# Patient Record
Sex: Female | Born: 1970 | Race: Black or African American | Hispanic: No | Marital: Single | Smoking: Current some day smoker
Health system: Southern US, Community
[De-identification: ages and names within clinical notes are randomized; demographics above are authoritative.]

## PROBLEM LIST (undated history)

## (undated) DIAGNOSIS — I82409 Acute embolism and thrombosis of unspecified deep veins of unspecified lower extremity: Secondary | ICD-10-CM

## (undated) DIAGNOSIS — N200 Calculus of kidney: Secondary | ICD-10-CM

## (undated) DIAGNOSIS — F1910 Other psychoactive substance abuse, uncomplicated: Secondary | ICD-10-CM

## (undated) DIAGNOSIS — G43909 Migraine, unspecified, not intractable, without status migrainosus: Secondary | ICD-10-CM

## (undated) DIAGNOSIS — K219 Gastro-esophageal reflux disease without esophagitis: Secondary | ICD-10-CM

## (undated) DIAGNOSIS — J189 Pneumonia, unspecified organism: Secondary | ICD-10-CM

## (undated) DIAGNOSIS — N390 Urinary tract infection, site not specified: Secondary | ICD-10-CM

## (undated) DIAGNOSIS — Z720 Tobacco use: Secondary | ICD-10-CM

## (undated) DIAGNOSIS — D649 Anemia, unspecified: Secondary | ICD-10-CM

## (undated) DIAGNOSIS — R569 Unspecified convulsions: Secondary | ICD-10-CM

## (undated) DIAGNOSIS — Z87442 Personal history of urinary calculi: Secondary | ICD-10-CM

## (undated) HISTORY — PX: ABDOMINAL HYSTERECTOMY: SHX81

## (undated) HISTORY — PX: ECTOPIC PREGNANCY SURGERY: SHX613

## (undated) HISTORY — PX: WISDOM TOOTH EXTRACTION: SHX21

---

## 2011-08-28 ENCOUNTER — Emergency Department (HOSPITAL_COMMUNITY)
Admission: EM | Admit: 2011-08-28 | Discharge: 2011-08-28 | Disposition: A | Discharge status: Home / Self Care | Payer: Medicaid Other | Attending: Emergency Medicine | Admitting: Emergency Medicine

## 2011-08-28 ENCOUNTER — Emergency Department (HOSPITAL_COMMUNITY): Payer: Medicaid Other

## 2011-08-28 DIAGNOSIS — R0602 Shortness of breath: Secondary | ICD-10-CM | POA: Insufficient documentation

## 2011-08-28 DIAGNOSIS — G43909 Migraine, unspecified, not intractable, without status migrainosus: Secondary | ICD-10-CM | POA: Insufficient documentation

## 2011-08-28 DIAGNOSIS — F411 Generalized anxiety disorder: Secondary | ICD-10-CM | POA: Insufficient documentation

## 2011-08-28 DIAGNOSIS — I059 Rheumatic mitral valve disease, unspecified: Secondary | ICD-10-CM | POA: Insufficient documentation

## 2011-08-28 DIAGNOSIS — R079 Chest pain, unspecified: Secondary | ICD-10-CM | POA: Insufficient documentation

## 2011-08-28 LAB — DIFFERENTIAL
Eosinophils Absolute: 0.1 10*3/uL (ref 0.0–0.7)
Eosinophils Relative: 2 % (ref 0–5)
Lymphs Abs: 2.6 10*3/uL (ref 0.7–4.0)
Monocytes Absolute: 0.4 10*3/uL (ref 0.1–1.0)
Monocytes Relative: 6 % (ref 3–12)

## 2011-08-28 LAB — CBC
MCH: 28.3 pg (ref 26.0–34.0)
MCV: 86 fL (ref 78.0–100.0)
Platelets: 254 10*3/uL (ref 150–400)
RBC: 4.2 MIL/uL (ref 3.87–5.11)
RDW: 13.9 % (ref 11.5–15.5)

## 2011-08-28 LAB — BASIC METABOLIC PANEL
BUN: 10 mg/dL (ref 6–23)
GFR calc non Af Amer: 85 mL/min — ABNORMAL LOW (ref >90)
Glucose, Bld: 90 mg/dL (ref 70–99)
Potassium: 3.8 mEq/L (ref 3.5–5.1)

## 2012-02-08 ENCOUNTER — Encounter (HOSPITAL_COMMUNITY): Payer: Self-pay | Admitting: Emergency Medicine

## 2012-02-08 ENCOUNTER — Emergency Department (HOSPITAL_COMMUNITY)
Admission: EM | Admit: 2012-02-08 | Discharge: 2012-02-09 | Disposition: A | Discharge status: Home / Self Care | Payer: Medicaid Other | Attending: Emergency Medicine | Admitting: Emergency Medicine

## 2012-02-08 DIAGNOSIS — G40909 Epilepsy, unspecified, not intractable, without status epilepticus: Secondary | ICD-10-CM | POA: Insufficient documentation

## 2012-02-08 DIAGNOSIS — R51 Headache: Secondary | ICD-10-CM | POA: Insufficient documentation

## 2012-02-08 DIAGNOSIS — Z79899 Other long term (current) drug therapy: Secondary | ICD-10-CM | POA: Insufficient documentation

## 2012-02-08 DIAGNOSIS — R197 Diarrhea, unspecified: Secondary | ICD-10-CM | POA: Insufficient documentation

## 2012-02-08 DIAGNOSIS — R112 Nausea with vomiting, unspecified: Secondary | ICD-10-CM

## 2012-02-08 DIAGNOSIS — K089 Disorder of teeth and supporting structures, unspecified: Secondary | ICD-10-CM | POA: Insufficient documentation

## 2012-02-08 DIAGNOSIS — R109 Unspecified abdominal pain: Secondary | ICD-10-CM | POA: Insufficient documentation

## 2012-02-08 HISTORY — DX: Migraine, unspecified, not intractable, without status migrainosus: G43.909

## 2012-02-08 HISTORY — DX: Unspecified convulsions: R56.9

## 2012-02-08 LAB — POCT I-STAT, CHEM 8
BUN: 9 mg/dL (ref 6–23)
Chloride: 111 mEq/L (ref 96–112)
HCT: 38 % (ref 36.0–46.0)
Potassium: 3.3 mEq/L — ABNORMAL LOW (ref 3.5–5.1)
Sodium: 141 mEq/L (ref 135–145)

## 2012-02-08 LAB — URINALYSIS, ROUTINE W REFLEX MICROSCOPIC
Glucose, UA: NEGATIVE mg/dL
Hgb urine dipstick: NEGATIVE
Ketones, ur: 40 mg/dL — AB
Protein, ur: 30 mg/dL — AB

## 2012-02-08 LAB — DIFFERENTIAL
Eosinophils Absolute: 0 10*3/uL (ref 0.0–0.7)
Eosinophils Relative: 0 % (ref 0–5)
Lymphs Abs: 1.4 10*3/uL (ref 0.7–4.0)
Monocytes Absolute: 0.7 10*3/uL (ref 0.1–1.0)
Monocytes Relative: 15 % — ABNORMAL HIGH (ref 3–12)

## 2012-02-08 LAB — CBC
MCH: 28.6 pg (ref 26.0–34.0)
MCV: 82.8 fL (ref 78.0–100.0)
Platelets: 161 10*3/uL (ref 150–400)
RBC: 4.3 MIL/uL (ref 3.87–5.11)

## 2012-02-08 LAB — URINE MICROSCOPIC-ADD ON

## 2012-02-08 MED ORDER — HYDROMORPHONE HCL PF 1 MG/ML IJ SOLN
0.5000 mg | Freq: Once | INTRAMUSCULAR | Status: AC
  Administered 2012-02-08: 0.5 mg via INTRAVENOUS
  Filled 2012-02-08: qty 1

## 2012-02-08 MED ORDER — PROMETHAZINE HCL 25 MG RE SUPP: 25.0000 mg | Freq: Four times a day (QID) | RECTAL | Status: DC | PRN

## 2012-02-08 MED ORDER — ONDANSETRON HCL 4 MG/2ML IJ SOLN
4.0000 mg | Freq: Once | INTRAMUSCULAR | Status: AC
  Administered 2012-02-08: 4 mg via INTRAVENOUS
  Filled 2012-02-08: qty 2

## 2012-02-08 MED ORDER — HYDROCODONE-ACETAMINOPHEN 5-325 MG PO TABS: 1.0000 | ORAL_TABLET | ORAL | Status: AC | PRN

## 2012-02-08 MED ORDER — SODIUM CHLORIDE 0.9 % IV BOLUS (SEPSIS)
1000.0000 mL | Freq: Once | INTRAVENOUS | Status: AC
  Administered 2012-02-08: 1000 mL via INTRAVENOUS

## 2012-02-08 MED ORDER — MORPHINE SULFATE 4 MG/ML IJ SOLN
4.0000 mg | Freq: Once | INTRAMUSCULAR | Status: AC
  Administered 2012-02-08: 4 mg via INTRAVENOUS
  Filled 2012-02-08: qty 1

## 2012-02-08 MED ORDER — PROMETHAZINE HCL 25 MG PO TABS: 25.0000 mg | ORAL_TABLET | Freq: Four times a day (QID) | ORAL | Status: DC | PRN

## 2012-02-08 MED ORDER — PENICILLIN V POTASSIUM 250 MG PO TABS: 250.0000 mg | ORAL_TABLET | Freq: Four times a day (QID) | ORAL | Status: AC

## 2012-02-08 MED ORDER — PROMETHAZINE HCL 25 MG/ML IJ SOLN
25.0000 mg | Freq: Once | INTRAMUSCULAR | Status: AC
  Administered 2012-02-08: 25 mg via INTRAVENOUS
  Filled 2012-02-08: qty 1

## 2012-02-08 NOTE — ED Notes (Signed)
Ginger ale offered at this time.

## 2012-02-08 NOTE — Discharge Instructions (Signed)
You have been treated for nausea, vomiting, diarrhea here today. Please stick to a clear liquid diet for the next 24 hours then slowly advance back to normal diet as tolerated. Use the Phenergan as needed for nausea. You've been given prescription for tablet and suppository form to use in case he cannot keep down the tablet. Use the pain medicine as needed. Take the penicillin for dental pain until it is gone. Return to the ER for worsening condition, high fever not controlled by medication, or any other worrisome symptoms.  Clear Liquid Diet The clear liquid dietconsists of foods that are liquid or will become liquid at room temperature.You should be able to see through the liquid and beverages. Examples of foods allowed on a clear liquid diet include fruit juice, broth or bouillon, gelatin, or frozen ice pops. The purpose of this diet is to provide necessary fluid, electrolytes such as sodium and potassium, and energy to keep the body functioning during times when you are not able to consume a regular diet.A clear liquid diet should not be continued for long periods of time as it is not nutritionally adequate.  REASONS FOR USING A CLEAR LIQUID DIET  In sudden onset (acute) conditions for a patient before or after surgery.   As the first step in oral feeding.   For fluid and electrolyte replacement in diarrheal diseases.   As a diet before certain medical tests are performed.  ADEQUACY The clear liquid diet is adequate only in ascorbic acid, according to the Recommended Dietary Allowances of the [employer]. CHOOSING FOODS Breads and Starches  Allowed:  None are allowed.   Avoid: All are avoided.  Vegetables  Allowed:  Strained tomato or vegetable juice.   Avoid: Any others.  Fruit  Allowed:  Strained fruit juices and fruit drinks. Include 1 serving of citrus or vitamin C-enriched fruit juice daily.   Avoid: Any others.  Meat and Meat Substitutes  Allowed:  None  are allowed.   Avoid: All are avoided.  Milk  Allowed:  None are allowed.   Avoid: All are avoided.  Soups and Combination Foods  Allowed:  Clear bouillon, broth, or strained broth-based soups.   Avoid: Any others.  Desserts and Sweets  Allowed:  Sugar, honey. High protein gelatin. Flavored gelatin, ices, or frozen ice pops that do not contain milk.   Avoid: Any others.  Fats and Oils  Allowed:  None are allowed.   Avoid: All are avoided.  Beverages  Allowed: Cereal beverages, coffee (regular or decaffeinated), tea, or soda at the discretion of your caregiver.   Avoid: Any others.  Condiments  Allowed:  Iodized salt.   Avoid: Any others, including pepper.  Supplements  Allowed:  Liquid nutrition beverages.   Avoid: Any others that contain lactose or fiber.  SAMPLE MEAL PLAN Breakfast  4 oz (120 mL) strained orange juice.    to 1 cup (125 to 250 mL) gelatin (plain or fortified).   1 cup (250 mL) beverage (coffee or tea).   Sugar, if desired.  Midmorning Snack   cup (125 mL) gelatin (plain or fortified).  Lunch  1 cup (250 mL) broth or consomm.   4 oz (120 mL) strained grapefruit juice.    cup (125 mL) gelatin (plain or fortified).   1 cup (250 mL) beverage (coffee or tea).   Sugar, if desired.  Midafternoon Snack   cup (125 mL) fruit ice.    cup (125 mL) strained fruit juice.  Dinner  1 cup (250 mL) broth or consomm.    cup (125 mL) cranberry juice.    cup (125 mL) flavored gelatin (plain or fortified).   1 cup (250 mL) beverage (coffee or tea).   Sugar, if desired.  Evening Snack  4 oz (120 mL) strained apple juice (vitamin C-fortified).    cup (125 mL) flavored gelatin (plain or fortified).  Document Released: 10/19/2005 Document Revised: 10/08/2011 Document Reviewed: 01/16/2011 [facility] Patient Information 2012 [place], [place].

## 2012-02-08 NOTE — ED Provider Notes (Signed)
Signout from [place], [place] at 2000. Patient presented with abdominal pain. She states that her entire family had similar symptoms. She has additionally had a headache which she has attributed to dental pain. She has been treated with pain and nausea medicine here and is currently undergoing fluid challenge.  Patient was rechecked around 10 PM and had had a recurrent episode of emesis after receiving Zofran and attempting fluid challenge. She was remedicated with Phenergan. Will reattempt fluid challenge.  11:35 PM Patient states that she is currently feeling better and has had no recurrent episodes of emesis. Abdomen remained soft and minimally tender. Will discharge home with pain, nausea, antibiotic medication. Return precautions discussed.   Grant Fontana, [place] 02/08/12 2336

## 2012-02-08 NOTE — ED Provider Notes (Signed)
History     CSN: [id number]  Arrival date & time 02/08/12  1531   First MD Initiated Contact with Patient 02/08/12 1715      Chief Complaint  Patient presents with  . Abdominal Pain  . Nausea    (Consider location/radiation/quality/duration/timing/severity/associated sxs/prior treatment) Patient is a 41 y.o. female presenting with abdominal pain and headaches. The history is provided by the patient.  Abdominal Pain The primary symptoms of the illness include abdominal pain, nausea, vomiting and diarrhea. The primary symptoms of the illness do not include fever or shortness of breath. The current episode started yesterday. The onset of the illness was gradual.  Vomiting occurs 2 to 5 times per day.  Symptoms associated with the illness do not include chills. Associated symptoms comments: She reports several family members at home with same symptoms. .  Headache  The current episode started yesterday. The problem occurs constantly. The problem has been gradually worsening. Associated symptoms include nausea and vomiting. Pertinent negatives include no fever and no shortness of breath. Associated symptoms comments: She reports her headache moved into her upper jaw and she now associates it with teeth that are painful. No facial swelling..    Past Medical History  Diagnosis Date  . Migraines   . Seizure     Past Surgical History  Procedure Date  . Ectopic pregnancy surgery     No family history on file.  History  Substance Use Topics  . Smoking status: Current Everyday Smoker    Types: Cigarettes  . Smokeless tobacco: Not on file  . Alcohol Use: Yes     socially    OB History    Grav Para Term Preterm Abortions TAB SAB Ect Mult Living                  Review of Systems  Constitutional: Negative for fever and chills.  HENT: Positive for dental problem.   Respiratory: Negative.  Negative for shortness of breath.   Cardiovascular: Negative.  Negative for chest pain.    Gastrointestinal: Positive for nausea, vomiting, abdominal pain and diarrhea. Negative for blood in stool.  Musculoskeletal: Negative.   Skin: Negative.   Neurological: Positive for headaches.    Allergies  Sulfur  Home Medications   Current Outpatient Rx  Name Route Sig Dispense Refill  . CARBAMAZEPINE ER 100 MG PO TB12 Oral Take 100 mg by mouth 2 (two) times daily.    . IBUPROFEN 400 MG PO TABS Oral Take 400 mg by mouth every 6 (six) hours as needed. For pain relief    . PSEUDOEPH-DOXYLAMINE-DM-APAP 60-7.03-31-999 MG/30ML PO LIQD Oral Take 30 mLs by mouth every 6 (six) hours as needed. For night time cold/flu symptom relief    . PSEUDOEPHEDRINE-APAP-DM 40-981-19 MG/30ML PO LIQD Oral Take 30 mLs by mouth every 4 (four) hours as needed. For daytime cold/flu symptom relief    . SUMATRIPTAN SUCCINATE 25 MG PO TABS Oral Take 25 mg by mouth every 2 (two) hours as needed.      BP 118/75  Pulse 60  Temp(Src) 98.1 F (36.7 C) (Oral)  Resp 16  SpO2 98%  LMP 01/22/2012  Physical Exam  Constitutional: She is oriented to person, place, and time. She appears well-developed and well-nourished.  HENT:  Head: Normocephalic.  Neck: Normal range of motion. Neck supple.  Cardiovascular: Normal rate and regular rhythm.   Pulmonary/Chest: Effort normal and breath sounds normal.  Abdominal: Soft. Bowel sounds are normal. There is no tenderness. There  is no rebound and no guarding.  Musculoskeletal: Normal range of motion.  Neurological: She is alert and oriented to person, place, and time. No cranial nerve deficit.  Skin: Skin is warm and dry. No rash noted.  Psychiatric: She has a normal mood and affect.    ED Course  Procedures (including critical care time)  Labs Reviewed  CBC - Abnormal; Notable for the following:    HCT 35.6 (*)    All other components within normal limits  DIFFERENTIAL - Abnormal; Notable for the following:    Monocytes Relative 15 (*)    All other components  within normal limits  URINALYSIS, ROUTINE W REFLEX MICROSCOPIC - Abnormal; Notable for the following:    APPearance TURBID (*)    Specific Gravity, Urine 1.031 (*)    Bilirubin Urine SMALL (*)    Ketones, ur 40 (*)    Protein, ur 30 (*)    All other components within normal limits  URINE MICROSCOPIC-ADD ON - Abnormal; Notable for the following:    Bacteria, UA MANY (*)    All other components within normal limits  POCT I-STAT, CHEM 8 - Abnormal; Notable for the following:    Potassium 3.3 (*)    All other components within normal limits   No results found.   No diagnosis found.    MDM  Patient care turned over to [facility]. She is attempting PO challenge of fluids and a second liter of IV fluids. Anticipate discharge home.         Rodena Medin, PA-C 02/08/12 2010

## 2012-02-08 NOTE — ED Notes (Signed)
Pt presenting to ed with c/o abdominal pain with positive nausea, vomiting and diarrhea x 2-3 days pt states her entire family has been sick with the same symptoms

## 2012-02-08 NOTE — ED Notes (Signed)
Offered ginger ale. 

## 2012-02-09 NOTE — ED Provider Notes (Signed)
Medical screening examination/treatment/procedure(s) were performed by non-physician practitioner and as supervising physician I was immediately available for consultation/collaboration.  Patient seen by me. Complains of nausea vomiting diarrhea. Also right temporal headache secondary to toothache  Donnetta Hutching, MD 02/09/12 1558

## 2012-02-09 NOTE — ED Provider Notes (Signed)
Medical screening examination/treatment/procedure(s) were conducted as a shared visit with non-physician practitioner(s) and myself.  I personally evaluated the patient during the encounter.  Patient complains of flulike symptoms including nausea vomiting and diarrhea. Also complains of right sided temporal headache secondary to tooth infection.  She is not septic and nontoxic  Donnetta Hutching, MD 02/09/12 1556

## 2012-03-09 ENCOUNTER — Encounter (HOSPITAL_COMMUNITY): Payer: Self-pay | Admitting: *Deleted

## 2012-03-09 ENCOUNTER — Inpatient Hospital Stay (HOSPITAL_COMMUNITY)
Admission: AD | Admit: 2012-03-09 | Discharge: 2012-03-09 | Disposition: A | Discharge status: Home / Self Care | Payer: Medicaid Other | Source: Ambulatory Visit | Attending: Obstetrics & Gynecology | Admitting: Obstetrics & Gynecology

## 2012-03-09 DIAGNOSIS — N760 Acute vaginitis: Secondary | ICD-10-CM | POA: Insufficient documentation

## 2012-03-09 DIAGNOSIS — B9689 Other specified bacterial agents as the cause of diseases classified elsewhere: Secondary | ICD-10-CM | POA: Insufficient documentation

## 2012-03-09 DIAGNOSIS — R109 Unspecified abdominal pain: Secondary | ICD-10-CM | POA: Insufficient documentation

## 2012-03-09 DIAGNOSIS — N390 Urinary tract infection, site not specified: Secondary | ICD-10-CM | POA: Insufficient documentation

## 2012-03-09 DIAGNOSIS — A499 Bacterial infection, unspecified: Secondary | ICD-10-CM | POA: Insufficient documentation

## 2012-03-09 HISTORY — DX: Urinary tract infection, site not specified: N39.0

## 2012-03-09 LAB — URINALYSIS, ROUTINE W REFLEX MICROSCOPIC
Bilirubin Urine: NEGATIVE
Protein, ur: NEGATIVE mg/dL
Urobilinogen, UA: 0.2 mg/dL (ref 0.0–1.0)

## 2012-03-09 LAB — WET PREP, GENITAL: Yeast Wet Prep HPF POC: NONE SEEN

## 2012-03-09 LAB — URINE MICROSCOPIC-ADD ON

## 2012-03-09 MED ORDER — METRONIDAZOLE 500 MG PO TABS: 500.0000 mg | ORAL_TABLET | Freq: Two times a day (BID) | ORAL | Status: AC

## 2012-03-09 MED ORDER — OXYCODONE-ACETAMINOPHEN 5-325 MG PO TABS
1.0000 | ORAL_TABLET | Freq: Four times a day (QID) | ORAL | Status: DC | PRN
  Administered 2012-03-09: 2 via ORAL
  Filled 2012-03-09 (×2): qty 1

## 2012-03-09 MED ORDER — OXYCODONE-ACETAMINOPHEN 5-325 MG PO TABS: 1.0000 | ORAL_TABLET | Freq: Four times a day (QID) | ORAL | Status: AC | PRN

## 2012-03-09 MED ORDER — CIPROFLOXACIN HCL 500 MG PO TABS: 500.0000 mg | ORAL_TABLET | Freq: Two times a day (BID) | ORAL | Status: AC

## 2012-03-09 MED ORDER — PHENAZOPYRIDINE HCL 100 MG PO TABS: 100.0000 mg | ORAL_TABLET | Freq: Three times a day (TID) | ORAL | Status: AC | PRN

## 2012-03-09 NOTE — MAU Note (Signed)
Pt states bladder pressure/uti s/s x2 weeks. Became dramatically worse 2 days ago. Hx incontinence post delivery 2 years ago. Extreme pain with intercourse. +CVA tenderness R side only. Noted pink d/c on tissue after wiping days ago, none present now. LMP- 02/07/2012

## 2012-03-09 NOTE — Discharge Instructions (Signed)
Urinary Tract Infection  Infections of the urinary tract can start in several places. A bladder infection (cystitis), a kidney infection (pyelonephritis), and a prostate infection (prostatitis) are different types of urinary tract infections (UTIs). They usually get better if treated with medicines (antibiotics) that kill germs. Take all the medicine until it is gone. You or your child may feel better in a few days, but TAKE ALL MEDICINE or the infection may not respond and may become more difficult to treat.  HOME CARE INSTRUCTIONS    Drink enough water and fluids to keep the urine clear or pale yellow. Cranberry juice is especially recommended, in addition to large amounts of water.   Avoid caffeine, tea, and carbonated beverages. They tend to irritate the bladder.   Alcohol may irritate the prostate.   Only take over-the-counter or prescription medicines for pain, discomfort, or fever as directed by your caregiver.  To prevent further infections:   Empty the bladder often. Avoid holding urine for long periods of time.   After a bowel movement, women should cleanse from front to back. Use each tissue only once.   Empty the bladder before and after sexual intercourse.  FINDING OUT THE RESULTS OF YOUR TEST  Not all test results are available during your visit. If your or your child's test results are not back during the visit, make an appointment with your caregiver to find out the results. Do not assume everything is normal if you have not heard from your caregiver or the medical facility. It is important for you to follow up on all test results.  SEEK MEDICAL CARE IF:    There is back pain.   Your baby is older than 3 months with a rectal temperature of 100.5 F (38.1 C) or higher for more than 1 day.   Your or your child's problems (symptoms) are no better in 3 days. Return sooner if you or your child is getting worse.  SEEK IMMEDIATE MEDICAL CARE IF:    There is severe back pain or lower abdominal  pain.   You or your child develops chills.   You have a fever.   Your baby is older than 3 months with a rectal temperature of 102 F (38.9 C) or higher.   Your baby is 3 months old or younger with a rectal temperature of 100.4 F (38 C) or higher.   There is nausea or vomiting.   There is continued burning or discomfort with urination.  MAKE SURE YOU:    Understand these instructions.   Will watch your condition.   Will get help right away if you are not doing well or get worse.  Document Released: 07/29/2005 Document Revised: 10/08/2011 Document Reviewed: 03/03/2007  [employer] Patient Information 2012 [employer].    Bacterial Vaginosis  Bacterial vaginosis (BV) is a vaginal infection where the normal balance of bacteria in the vagina is disrupted. The normal balance is then replaced by an overgrowth of certain bacteria. There are several different kinds of bacteria that can cause BV. BV is the most common vaginal infection in women of childbearing age.  CAUSES    The cause of BV is not fully understood. BV develops when there is an increase or imbalance of harmful bacteria.   Some activities or behaviors can upset the normal balance of bacteria in the vagina and put women at increased risk including:   Having a new sex partner or multiple sex partners.   Douching.   Using an intrauterine   device (IUD) for contraception.   It is not clear what role sexual activity plays in the development of BV. However, women that have never had sexual intercourse are rarely infected with BV.  Women do not get BV from toilet seats, bedding, swimming pools or from touching objects around them.   SYMPTOMS    Grey vaginal discharge.   A fish-like odor with discharge, especially after sexual intercourse.   Itching or burning of the vagina and vulva.   Burning or pain with urination.   Some women have no signs or symptoms at all.  DIAGNOSIS   Your caregiver must examine the vagina for signs of BV. Your  caregiver will perform lab tests and look at the sample of vaginal fluid through a microscope. They will look for bacteria and abnormal cells (clue cells), a pH test higher than 4.5, and a positive amine test all associated with BV.   RISKS AND COMPLICATIONS    Pelvic inflammatory disease (PID).   Infections following gynecology surgery.   Developing HIV.   Developing herpes virus.  TREATMENT   Sometimes BV will clear up without treatment. However, all women with symptoms of BV should be treated to avoid complications, especially if gynecology surgery is planned. Female partners generally do not need to be treated. However, BV may spread between female sex partners so treatment is helpful in preventing a recurrence of BV.    BV may be treated with antibiotics. The antibiotics come in either pill or vaginal cream forms. Either can be used with nonpregnant or pregnant women, but the recommended dosages differ. These antibiotics are not harmful to the baby.   BV can recur after treatment. If this happens, a second round of antibiotics will often be prescribed.   Treatment is important for pregnant women. If not treated, BV can cause a premature delivery, especially for a pregnant woman who had a premature birth in the past. All pregnant women who have symptoms of BV should be checked and treated.   For chronic reoccurrence of BV, treatment with a type of prescribed gel vaginally twice a week is helpful.  HOME CARE INSTRUCTIONS    Finish all medication as directed by your caregiver.   Do not have sex until treatment is completed.   Tell your sexual partner that you have a vaginal infection. They should see their caregiver and be treated if they have problems, such as a mild rash or itching.   Practice safe sex. Use condoms. Only have 1 sex partner.  PREVENTION   Basic prevention steps can help reduce the risk of upsetting the natural balance of bacteria in the vagina and developing BV:   Do not have sexual  intercourse (be abstinent).   Do not douche.   Use all of the medicine prescribed for treatment of BV, even if the signs and symptoms go away.   Tell your sex partner if you have BV. That way, they can be treated, if needed, to prevent reoccurrence.  SEEK MEDICAL CARE IF:    Your symptoms are not improving after 3 days of treatment.   You have increased discharge, pain, or fever.  MAKE SURE YOU:    Understand these instructions.   Will watch your condition.   Will get help right away if you are not doing well or get worse.  FOR MORE INFORMATION   Division of STD Prevention (DSTDP), Centers for Disease Control and Prevention: www.cdc.gov/std  American Social Health Association (ASHA): www.ashastd.org   Document Released:

## 2012-03-09 NOTE — MAU Provider Note (Signed)
Attestation of Attending Supervision of Advanced Practitioner: Evaluation and management procedures were performed by the OB Fellow/PA/CNM/NP under my supervision and collaboration. Chart reviewed, and agree with management and plan.  Cheyenne Schumm, M.D. 03/09/2012 9:11 PM   

## 2012-03-09 NOTE — Progress Notes (Signed)
Pt given warm pack and instructed not to have direct contact with skin.

## 2012-03-09 NOTE — MAU Provider Note (Signed)
Jasmine Christian [id number] @Unknown  by LMP Chief Complaint  Patient presents with  . Flank Pain  . Urinary Tract Infection     First Provider Initiated Contact with Patient 03/09/12 1823      SUBJECTIVE  HPI: Presents with 4 day hx of dysuria, frequency, urgency, hematuria progressively worse today with continuous suprapubic pain and right flank pain. No fever, N/V. No hx stones, but had frequent UTIs as a teen and was seen by urologist who told her her bladder was very small and did an IVP.    Past Medical History  Diagnosis Date  . Migraines   . Seizure   . Ectopic pregnancy   . UTI (lower urinary tract infection)    Past Surgical History  Procedure Date  . Ectopic pregnancy surgery   . Tubal ligation   . Wisdom tooth extraction    History   Social History  . Marital Status: Single    Spouse Name: N/A    Number of Children: N/A  . Years of Education: N/A   Occupational History  . Not on file.   Social History Main Topics  . Smoking status: Current Everyday Smoker -- 0.5 packs/day    Types: Cigarettes  . Smokeless tobacco: Not on file  . Alcohol Use: No     socially  . Drug Use: No  . Sexually Active:    Other Topics Concern  . Not on file   Social History Narrative  . No narrative on file   No current facility-administered medications on file prior to encounter.   Current Outpatient Prescriptions on File Prior to Encounter  Medication Sig Dispense Refill  . ibuprofen (ADVIL,MOTRIN) 400 MG tablet Take 400 mg by mouth every 6 (six) hours as needed. For pain      . SUMAtriptan (IMITREX) 20 MG/ACT nasal spray Place 1 spray into the nose every 2 (two) hours as needed. For migraine      . promethazine (PHENERGAN) 25 MG suppository Place 1 suppository (25 mg total) rectally every 6 (six) hours as needed for nausea.  12 each  0  . promethazine (PHENERGAN) 25 MG tablet Take 1 tablet (25 mg total) by mouth every 6 (six) hours as needed for nausea.  30 tablet   0   Allergies  Allergen Reactions  . Sulfur Anaphylaxis  . Aspartame And Phenylalanine Swelling    Swelling of throat, fever    ROS: Pertinent items in HPI  OBJECTIVE Blood pressure 120/64, pulse 71, temperature 98 F (36.7 C), temperature source Oral, resp. rate 16, height 5\' 7"  (1.702 m), weight 70.308 kg (155 lb), last menstrual period 02/07/2012, SpO2 100.00%.  GENERAL: Well-developed, well-nourished female in no acute distress.  HEENT: Normocephalic, good dentition HEART: normal rate RESP: normal effort ABDOMEN: Soft, nontender EXTREMITIES: Nontender, no edema NEURO: Alert and oriented SPECULUM EXAM: NEFG, copious white curdy discharge discharge, no blood noted, cervix multiparous and clean BIMANUAL: cervix without CMT; uterus retroverted, retroflexed; no adnexal tenderness or masses   LAB RESULTS  Results for orders placed during the hospital encounter of 03/09/12 (from the past 24 hour(s))  URINALYSIS, ROUTINE W REFLEX MICROSCOPIC     Status: Abnormal   Collection Time   03/09/12  5:01 PM      Component Value Range   Color, Urine YELLOW  YELLOW    APPearance CLOUDY (*) CLEAR    Specific Gravity, Urine 1.025  1.005 - 1.030    pH 6.0  5.0 - 8.0    Glucose,  UA NEGATIVE  NEGATIVE (mg/dL)   Hgb urine dipstick TRACE (*) NEGATIVE    Bilirubin Urine NEGATIVE  NEGATIVE    Ketones, ur NEGATIVE  NEGATIVE (mg/dL)   Protein, ur NEGATIVE  NEGATIVE (mg/dL)   Urobilinogen, UA 0.2  0.0 - 1.0 (mg/dL)   Nitrite POSITIVE (*) NEGATIVE    Leukocytes, UA SMALL (*) NEGATIVE   URINE MICROSCOPIC-ADD ON     Status: Abnormal   Collection Time   03/09/12  5:01 PM      Component Value Range   Squamous Epithelial / LPF MANY (*) RARE    WBC, UA 21-50  <3 (WBC/hpf)   RBC / HPF 0-2  <3 (RBC/hpf)   Bacteria, UA MANY (*) RARE   POCT PREGNANCY, URINE     Status: Normal   Collection Time   03/09/12  5:10 PM      Component Value Range   Preg Test, Ur NEGATIVE  NEGATIVE   WET PREP, GENITAL      Status: Abnormal   Collection Time   03/09/12  7:00 PM      Component Value Range   Yeast Wet Prep HPF POC NONE SEEN  NONE SEEN    Trich, Wet Prep NONE SEEN  NONE SEEN    Clue Cells Wet Prep HPF POC MANY (*) NONE SEEN    WBC, Wet Prep HPF POC MODERATE (*) NONE SEEN        ASSESSMENT UTI, early right pyelonephritis BV  PLAN Rx Cipro 566m bid x 10 d, Pyridium 200 mg bid. Instructions on force fluids and general measures. If not improved after 48 hr Cipro, follow up at [facility]. Rx Flagyl  See [facility] re: contraception.     Jasmine Christian 03/09/2012 6:54 PM

## 2012-10-03 ENCOUNTER — Encounter (HOSPITAL_COMMUNITY): Payer: Self-pay | Admitting: *Deleted

## 2012-10-03 ENCOUNTER — Emergency Department (HOSPITAL_COMMUNITY)
Admission: EM | Admit: 2012-10-03 | Discharge: 2012-10-04 | Disposition: A | Discharge status: Home / Self Care | Payer: Medicaid Other | Attending: Emergency Medicine | Admitting: Emergency Medicine

## 2012-10-03 DIAGNOSIS — Z8669 Personal history of other diseases of the nervous system and sense organs: Secondary | ICD-10-CM | POA: Insufficient documentation

## 2012-10-03 DIAGNOSIS — H9209 Otalgia, unspecified ear: Secondary | ICD-10-CM | POA: Insufficient documentation

## 2012-10-03 DIAGNOSIS — R112 Nausea with vomiting, unspecified: Secondary | ICD-10-CM | POA: Insufficient documentation

## 2012-10-03 DIAGNOSIS — Z79899 Other long term (current) drug therapy: Secondary | ICD-10-CM | POA: Insufficient documentation

## 2012-10-03 DIAGNOSIS — Z87440 Personal history of urinary (tract) infections: Secondary | ICD-10-CM | POA: Insufficient documentation

## 2012-10-03 DIAGNOSIS — G43909 Migraine, unspecified, not intractable, without status migrainosus: Secondary | ICD-10-CM | POA: Insufficient documentation

## 2012-10-03 DIAGNOSIS — K029 Dental caries, unspecified: Secondary | ICD-10-CM | POA: Insufficient documentation

## 2012-10-03 DIAGNOSIS — H53149 Visual discomfort, unspecified: Secondary | ICD-10-CM | POA: Insufficient documentation

## 2012-10-03 DIAGNOSIS — F17200 Nicotine dependence, unspecified, uncomplicated: Secondary | ICD-10-CM | POA: Insufficient documentation

## 2012-10-03 MED ORDER — TRAMADOL HCL 50 MG PO TABS: 50.0000 mg | ORAL_TABLET | Freq: Four times a day (QID) | ORAL | Status: DC | PRN

## 2012-10-03 MED ORDER — KETOROLAC TROMETHAMINE 30 MG/ML IJ SOLN
30.0000 mg | Freq: Once | INTRAMUSCULAR | Status: AC
  Administered 2012-10-03: 30 mg via INTRAVENOUS
  Filled 2012-10-03: qty 1

## 2012-10-03 MED ORDER — DIPHENHYDRAMINE HCL 50 MG/ML IJ SOLN
25.0000 mg | Freq: Once | INTRAMUSCULAR | Status: AC
  Administered 2012-10-03: 25 mg via INTRAVENOUS
  Filled 2012-10-03: qty 1

## 2012-10-03 MED ORDER — METOCLOPRAMIDE HCL 10 MG PO TABS: 10.0000 mg | ORAL_TABLET | Freq: Four times a day (QID) | ORAL | Status: DC

## 2012-10-03 MED ORDER — HYDROMORPHONE HCL PF 1 MG/ML IJ SOLN
1.0000 mg | Freq: Once | INTRAMUSCULAR | Status: AC
  Administered 2012-10-03: 1 mg via INTRAVENOUS
  Filled 2012-10-03: qty 1

## 2012-10-03 MED ORDER — METOCLOPRAMIDE HCL 5 MG/ML IJ SOLN
10.0000 mg | Freq: Once | INTRAMUSCULAR | Status: AC
  Administered 2012-10-03: 10 mg via INTRAVENOUS
  Filled 2012-10-03: qty 2

## 2012-10-03 MED ORDER — FENTANYL CITRATE 0.05 MG/ML IJ SOLN
50.0000 ug | Freq: Once | INTRAMUSCULAR | Status: AC
  Administered 2012-10-03: 50 ug via INTRAVENOUS
  Filled 2012-10-03: qty 2

## 2012-10-03 MED ORDER — ONDANSETRON HCL 4 MG/2ML IJ SOLN
4.0000 mg | Freq: Once | INTRAMUSCULAR | Status: AC
  Administered 2012-10-03: 4 mg via INTRAVENOUS
  Filled 2012-10-03: qty 2

## 2012-10-03 NOTE — ED Provider Notes (Signed)
History     CSN: [id number]  Arrival date & time 10/03/12  1733   First MD Initiated Contact with Patient 10/03/12 2001      Chief Complaint  Patient presents with  . Migraine  . Nausea    (Consider location/radiation/quality/duration/timing/severity/associated sxs/prior treatment) HPI Comments: Patient with Hx of Migraine Ha that has been well controled with medication, has not had Ha in over a year but yesterday started with typical pain behind R eye.  Also of note patient  has sever dental decay on the upper and lower molars on the R.   Patient is a 41 y.o. female presenting with migraines. The history is provided by the patient.  Migraine This is a new problem. The current episode started yesterday. The problem has been unchanged. Associated symptoms include headaches, nausea and vomiting. Pertinent negatives include no arthralgias, chills, fever, sore throat or weakness. Nothing aggravates the symptoms. She has tried nothing for the symptoms.    Past Medical History  Diagnosis Date  . Migraines   . Seizure   . Ectopic pregnancy   . UTI (lower urinary tract infection)     Past Surgical History  Procedure Date  . Ectopic pregnancy surgery   . Tubal ligation   . Wisdom tooth extraction     Family History  Problem Relation Age of Onset  . Cancer Maternal Aunt   . Cancer Maternal Grandmother   . Cancer Maternal Grandfather     History  Substance Use Topics  . Smoking status: Current Every Day Smoker -- 0.5 packs/day    Types: Cigarettes  . Smokeless tobacco: Not on file  . Alcohol Use: No     Comment: socially    OB History    Grav Para Term Preterm Abortions TAB SAB Ect Mult Living   5 4 4  1   1  4       Review of Systems  Constitutional: Negative for fever and chills.  HENT: Positive for ear pain. Negative for sore throat and rhinorrhea.   Eyes: Positive for photophobia. Negative for pain.  Respiratory: Negative for shortness of breath.     Gastrointestinal: Positive for nausea and vomiting.  Genitourinary: Negative.   Musculoskeletal: Negative for arthralgias.  Neurological: Positive for headaches. Negative for dizziness and weakness.  Hematological: Negative for adenopathy.    Allergies  Sulfur and Aspartame and phenylalanine  Home Medications   Current Outpatient Rx  Name  Route  Sig  Dispense  Refill  . METOCLOPRAMIDE HCL 10 MG PO TABS   Oral   Take 1 tablet (10 mg total) by mouth every 6 (six) hours.   30 tablet   0   . TRAMADOL HCL 50 MG PO TABS   Oral   Take 1 tablet (50 mg total) by mouth every 6 (six) hours as needed for pain.   15 tablet   0     BP 129/68  Pulse 60  Temp 99.3 F (37.4 C) (Oral)  Resp 17  SpO2 98%  LMP 09/19/2012  Physical Exam  Constitutional: She is oriented to person, place, and time. She appears well-developed and well-nourished.  HENT:  Head: Normocephalic.  Eyes: Pupils are equal, round, and reactive to light.  Neck: Normal range of motion.  Cardiovascular: Normal rate.   Pulmonary/Chest: Effort normal.  Musculoskeletal: Normal range of motion. She exhibits no edema and no tenderness.  Neurological: She is alert and oriented to person, place, and time.  Skin: Skin is warm. No rash  noted.    ED Course  Procedures (including critical care time)  Labs Reviewed - No data to display No results found.   1. Migraine headache   2. Dental caries       MDM  Will treat with Benadryl, Toradol and Reglan         Arman Filter, NP 10/03/12 2342

## 2012-10-03 NOTE — ED Notes (Signed)
[id number]<JY> Expected date:<BR> Expected time:<BR> Means of arrival:<BR> Comments:<BR> Headache ems iv given zofran

## 2012-10-03 NOTE — ED Notes (Signed)
Pain is 3/10

## 2012-10-03 NOTE — ED Notes (Signed)
Per ems: pt c/o headache, nausea and vomiting that began this morning. Hx of migraines, pt did not have medication to take, h/a progressively got worse throughout day. 18 gauge in left hand, 4mg  zofran give en route. bp 150 palpated, pulse 60

## 2012-10-04 NOTE — ED Provider Notes (Signed)
Medical screening examination/treatment/procedure(s) were performed by non-physician practitioner and as supervising physician I was immediately available for consultation/collaboration.  Taylor Spilde R. Dilpreet Faires, MD 10/04/12 0014 

## 2013-05-11 ENCOUNTER — Emergency Department (HOSPITAL_COMMUNITY)
Admission: EM | Admit: 2013-05-11 | Discharge: 2013-05-11 | Disposition: A | Discharge status: Home / Self Care | Payer: Medicaid Other | Attending: Emergency Medicine | Admitting: Emergency Medicine

## 2013-05-11 ENCOUNTER — Encounter (HOSPITAL_COMMUNITY): Payer: Self-pay | Admitting: Emergency Medicine

## 2013-05-11 ENCOUNTER — Emergency Department (HOSPITAL_COMMUNITY): Payer: Medicaid Other

## 2013-05-11 DIAGNOSIS — Z8742 Personal history of other diseases of the female genital tract: Secondary | ICD-10-CM | POA: Insufficient documentation

## 2013-05-11 DIAGNOSIS — Z8679 Personal history of other diseases of the circulatory system: Secondary | ICD-10-CM | POA: Insufficient documentation

## 2013-05-11 DIAGNOSIS — Z8669 Personal history of other diseases of the nervous system and sense organs: Secondary | ICD-10-CM | POA: Insufficient documentation

## 2013-05-11 DIAGNOSIS — F17200 Nicotine dependence, unspecified, uncomplicated: Secondary | ICD-10-CM | POA: Insufficient documentation

## 2013-05-11 DIAGNOSIS — N200 Calculus of kidney: Secondary | ICD-10-CM | POA: Insufficient documentation

## 2013-05-11 DIAGNOSIS — R6883 Chills (without fever): Secondary | ICD-10-CM | POA: Insufficient documentation

## 2013-05-11 DIAGNOSIS — R112 Nausea with vomiting, unspecified: Secondary | ICD-10-CM | POA: Insufficient documentation

## 2013-05-11 DIAGNOSIS — Z3202 Encounter for pregnancy test, result negative: Secondary | ICD-10-CM | POA: Insufficient documentation

## 2013-05-11 DIAGNOSIS — N390 Urinary tract infection, site not specified: Secondary | ICD-10-CM | POA: Insufficient documentation

## 2013-05-11 LAB — CBC WITH DIFFERENTIAL/PLATELET
Basophils Relative: 0 % (ref 0–1)
Eosinophils Absolute: 0 10*3/uL (ref 0.0–0.7)
Hemoglobin: 12.4 g/dL (ref 12.0–15.0)
MCHC: 33.5 g/dL (ref 30.0–36.0)
Monocytes Relative: 8 % (ref 3–12)
Neutro Abs: 7.2 10*3/uL (ref 1.7–7.7)
Neutrophils Relative %: 68 % (ref 43–77)
Platelets: 274 10*3/uL (ref 150–400)
RBC: 4.52 MIL/uL (ref 3.87–5.11)

## 2013-05-11 LAB — BASIC METABOLIC PANEL
BUN: 11 mg/dL (ref 6–23)
Calcium: 9.5 mg/dL (ref 8.4–10.5)
GFR calc Af Amer: >90 mL/min (ref >90)
GFR calc non Af Amer: 82 mL/min — ABNORMAL LOW (ref >90)
Potassium: 3.6 mEq/L (ref 3.5–5.1)
Sodium: 134 mEq/L — ABNORMAL LOW (ref 135–145)

## 2013-05-11 LAB — URINALYSIS, ROUTINE W REFLEX MICROSCOPIC
Bilirubin Urine: NEGATIVE
Ketones, ur: NEGATIVE mg/dL
Nitrite: POSITIVE — AB
Urobilinogen, UA: 0.2 mg/dL (ref 0.0–1.0)
pH: 5 (ref 5.0–8.0)

## 2013-05-11 LAB — URINE MICROSCOPIC-ADD ON

## 2013-05-11 MED ORDER — MORPHINE SULFATE 4 MG/ML IJ SOLN
4.0000 mg | Freq: Once | INTRAMUSCULAR | Status: AC
  Administered 2013-05-11: 4 mg via INTRAVENOUS
  Filled 2013-05-11: qty 1

## 2013-05-11 MED ORDER — TAMSULOSIN HCL 0.4 MG PO CAPS: 0.4000 mg | ORAL_CAPSULE | Freq: Every day | ORAL | Status: DC

## 2013-05-11 MED ORDER — HYDROCODONE-ACETAMINOPHEN 5-325 MG PO TABS: 1.0000 | ORAL_TABLET | ORAL | Status: DC | PRN

## 2013-05-11 MED ORDER — CIPROFLOXACIN HCL 500 MG PO TABS: 500.0000 mg | ORAL_TABLET | Freq: Two times a day (BID) | ORAL | Status: DC

## 2013-05-11 MED ORDER — ONDANSETRON HCL 4 MG PO TABS: 4.0000 mg | ORAL_TABLET | Freq: Four times a day (QID) | ORAL | Status: DC

## 2013-05-11 MED ORDER — SODIUM CHLORIDE 0.9 % IV BOLUS (SEPSIS)
1000.0000 mL | Freq: Once | INTRAVENOUS | Status: AC
  Administered 2013-05-11: 1000 mL via INTRAVENOUS

## 2013-05-11 NOTE — ED Notes (Signed)
Pt transported to CT ?

## 2013-05-11 NOTE — ED Provider Notes (Signed)
History    CSN: [id number] Arrival date & time 05/11/13  1522  First MD Initiated Contact with Patient 05/11/13 1700     Chief Complaint  Patient presents with  . Abdominal Pain   (Consider location/radiation/quality/duration/timing/severity/associated sxs/prior Treatment) HPI  Patient is a 87 old female past medical history significant for ectopic pregnancy, recurrent UTIs, history of kidney stones presented to emergency department for any days of waxing and waning stabbing right-sided abdominal pain without radiation. Patient has associated nausea w/o vomiting. Patient rates her pain 8/10. No alleviating factors. Aggravating factors include palpation. Denies fevers, chills, diarrhea.  Past Medical History  Diagnosis Date  . Migraines   . Seizure   . Ectopic pregnancy   . UTI (lower urinary tract infection)    Past Surgical History  Procedure Laterality Date  . Ectopic pregnancy surgery    . Tubal ligation    . Wisdom tooth extraction     Family History  Problem Relation Age of Onset  . Cancer Maternal Aunt   . Cancer Maternal Grandmother   . Cancer Maternal Grandfather    History  Substance Use Topics  . Smoking status: Current Every Day Smoker -- 0.50 packs/day    Types: Cigarettes  . Smokeless tobacco: Not on file  . Alcohol Use: No     Comment: socially   OB History   Grav Para Term Preterm Abortions TAB SAB Ect Mult Living   5 4 4  1   1  4      Review of Systems  Constitutional: Positive for chills. Negative for fever.  HENT: Negative for neck pain.   Eyes: Negative.   Respiratory: Negative for shortness of breath.   Cardiovascular: Negative for chest pain.  Gastrointestinal: Positive for nausea and abdominal pain. Negative for vomiting.  Genitourinary: Positive for flank pain. Negative for hematuria.  Musculoskeletal: Negative for back pain.  Skin: Negative.   Neurological: Negative for headaches.    Allergies  Sulfur and Aspartame and  phenylalanine  Home Medications   Current Outpatient Rx  Name  Route  Sig  Dispense  Refill  . ibuprofen (ADVIL,MOTRIN) 200 MG tablet   Oral   Take 400 mg by mouth every 6 (six) hours as needed for pain.         . ciprofloxacin (CIPRO) 500 MG tablet   Oral   Take 1 tablet (500 mg total) by mouth 2 (two) times daily.   6 tablet   0   . HYDROcodone-acetaminophen (NORCO/VICODIN) 5-325 MG per tablet   Oral   Take 1 tablet by mouth every 4 (four) hours as needed for pain.   15 tablet   0   . ondansetron (ZOFRAN) 4 MG tablet   Oral   Take 1 tablet (4 mg total) by mouth every 6 (six) hours.   12 tablet   0   . tamsulosin (FLOMAX) 0.4 MG CAPS   Oral   Take 1 capsule (0.4 mg total) by mouth daily.   10 capsule   0    BP 119/77  Pulse 81  Temp(Src) 98.7 F (37.1 C) (Oral)  Resp 18  SpO2 100%  LMP 04/20/2013 Physical Exam  Constitutional: She is oriented to person, place, and time. She appears well-developed and well-nourished. No distress.  HENT:  Head: Normocephalic and atraumatic.  Eyes: Conjunctivae are normal.  Neck: Neck supple.  Cardiovascular: Normal rate, regular rhythm and normal heart sounds.   Pulmonary/Chest: Effort normal and breath sounds normal.  Abdominal: Soft.  Bowel sounds are normal. There is tenderness. There is no rigidity, no rebound and no guarding.    Neurological: She is alert and oriented to person, place, and time.  Skin: Skin is warm and dry. She is not diaphoretic.  Psychiatric: She has a normal mood and affect.    ED Course  Procedures (including critical care time)  Medications  sodium chloride 0.9 % bolus 1,000 mL (0 mLs Intravenous Stopped 05/11/13 1957)  morphine 4 MG/ML injection 4 mg (4 mg Intravenous Given 05/11/13 1823)  morphine 4 MG/ML injection 4 mg (4 mg Intravenous Given 05/11/13 1936)     Labs Reviewed  URINALYSIS, ROUTINE W REFLEX MICROSCOPIC - Abnormal; Notable for the following:    APPearance CLOUDY (*)    Hgb  urine dipstick MODERATE (*)    Protein, ur 30 (*)    Nitrite POSITIVE (*)    Leukocytes, UA LARGE (*)    All other components within normal limits  URINE MICROSCOPIC-ADD ON - Abnormal; Notable for the following:    Bacteria, UA FEW (*)    All other components within normal limits  BASIC METABOLIC PANEL - Abnormal; Notable for the following:    Sodium 134 (*)    GFR calc non Af Amer 82 (*)    All other components within normal limits  CBC WITH DIFFERENTIAL - Abnormal; Notable for the following:    WBC 10.6 (*)    All other components within normal limits  URINE CULTURE  POCT PREGNANCY, URINE   Ct Abdomen Pelvis Wo Contrast  05/11/2013   *RADIOLOGY REPORT*  Clinical Data: Abdominal pain.  Vomiting.  CT ABDOMEN AND PELVIS WITHOUT CONTRAST  Technique:  Multidetector CT imaging of the abdomen and pelvis was performed following the standard protocol without intravenous contrast.  Comparison: None.  Findings: Lung bases are clear.  No effusions.  Heart is normal size.  Liver, gallbladder, spleen, pancreas, adrenals have an unremarkable unenhanced appearance.  Bilateral punctate nonobstructing renal stones.  Ureters are decompressed.  There is a tiny punctate calcification posterior to the right lateral wall.  This could represent a tiny phlebolith, but I cannot completely exclude a nonobstructing distal right ureteral stone.  Recommend clinical correlation for right flank pain and hematuria. Small low-density area within the mid pole of the right kidney likely reflects small cyst which cannot be characterized on this unenhanced study.  Uterus is retroverted.  No adnexal masses.  No free fluid, free air or adenopathy.  Appendix is visualized and is normal.  Stomach, large and small bowel grossly unremarkable.  Aorta is normal caliber.  No acute bony abnormality.  IMPRESSION: Bilateral nephrolithiasis.  Punctate calcification in the right pelvis near the distal right ureter.  Cannot exclude a distal right  ureteral stone.  Recommend clinical correlation for hematuria and right flank pain.   Original Report Authenticated By: Charlett Nose, M.D.   1. Nephrolithiasis   2. UTI (urinary tract infection)     MDM  Abdominal exam is non-surgical w/o peritoneal signs. Pt has been diagnosed with a Kidney Stone via CT. Also noted to have UTI on UA. There is no evidence of significant hydronephrosis, serum creatine WNL, vitals sign stable and the pt does not have irratractable vomiting. Pt will be dc home with pain medications & has been advised to follow up with urologist. Abx prescribed for UTI. Patient is agreeable to plan. Patient is stable at time of discharge     Jeannetta Ellis, PA-C 05/11/13 2011

## 2013-05-11 NOTE — ED Notes (Signed)
Pt complains of right abdominal pain x 3 days. Pt denies vomiting or diarrhea at this time.

## 2013-05-11 NOTE — ED Notes (Signed)
Pt ambulated to bathroom 

## 2013-05-11 NOTE — Progress Notes (Signed)
Patient confirmed that she does not have a pcp.  EDCM confirmed for patient that she does have Medicaid insurance.  [facility] provided patient list of pcps who accept Medicaid insurance.  Patient thankful for resources.

## 2013-05-13 LAB — URINE CULTURE: Colony Count: >=100000

## 2013-05-14 ENCOUNTER — Telehealth (HOSPITAL_COMMUNITY): Payer: Self-pay | Admitting: Emergency Medicine

## 2013-05-14 NOTE — ED Notes (Signed)
Post ED Visit - Positive Culture Follow-up ° °Culture report reviewed by antimicrobial stewardship pharmacist: °[x] Wes Dulaney, Pharm.D., BCPS °[] Jeremy Frens, Pharm.D., BCPS °[] Elizabeth Martin, Pharm.D., BCPS °[] Minh Pham, Pharm.D., BCPS, AAHIVP °[] Michelle Turner, Pharm.D., BCPS, AAHIVP ° °Positive urine culture °Treated with Cipro, organism sensitive to the same and no further patient follow-up is required at this time. ° °Jasmine Christian °05/14/2013, 5:43 PM ° ° °

## 2013-05-14 NOTE — ED Provider Notes (Signed)
Medical screening examination/treatment/procedure(s) were performed by non-physician practitioner and as supervising physician I was immediately available for consultation/collaboration.   Avarose Mervine L Deletha Jaffee, MD 05/14/13 1112 

## 2013-05-15 ENCOUNTER — Emergency Department (HOSPITAL_COMMUNITY)
Admission: EM | Admit: 2013-05-15 | Discharge: 2013-05-15 | Disposition: A | Discharge status: Home / Self Care | Payer: Medicaid Other | Attending: Emergency Medicine | Admitting: Emergency Medicine

## 2013-05-15 ENCOUNTER — Encounter (HOSPITAL_COMMUNITY): Payer: Self-pay | Admitting: Emergency Medicine

## 2013-05-15 DIAGNOSIS — F17200 Nicotine dependence, unspecified, uncomplicated: Secondary | ICD-10-CM | POA: Insufficient documentation

## 2013-05-15 DIAGNOSIS — Z8669 Personal history of other diseases of the nervous system and sense organs: Secondary | ICD-10-CM | POA: Insufficient documentation

## 2013-05-15 DIAGNOSIS — Z3202 Encounter for pregnancy test, result negative: Secondary | ICD-10-CM | POA: Insufficient documentation

## 2013-05-15 DIAGNOSIS — Z79899 Other long term (current) drug therapy: Secondary | ICD-10-CM | POA: Insufficient documentation

## 2013-05-15 DIAGNOSIS — R109 Unspecified abdominal pain: Secondary | ICD-10-CM

## 2013-05-15 DIAGNOSIS — Z8679 Personal history of other diseases of the circulatory system: Secondary | ICD-10-CM | POA: Insufficient documentation

## 2013-05-15 DIAGNOSIS — N72 Inflammatory disease of cervix uteri: Secondary | ICD-10-CM | POA: Insufficient documentation

## 2013-05-15 DIAGNOSIS — Z87440 Personal history of urinary (tract) infections: Secondary | ICD-10-CM | POA: Insufficient documentation

## 2013-05-15 LAB — URINALYSIS, ROUTINE W REFLEX MICROSCOPIC
Hgb urine dipstick: NEGATIVE
Nitrite: NEGATIVE
Protein, ur: NEGATIVE mg/dL
Specific Gravity, Urine: 1.025 (ref 1.005–1.030)
Urobilinogen, UA: 0.2 mg/dL (ref 0.0–1.0)

## 2013-05-15 LAB — URINE MICROSCOPIC-ADD ON

## 2013-05-15 LAB — POCT PREGNANCY, URINE: Preg Test, Ur: NEGATIVE

## 2013-05-15 LAB — WET PREP, GENITAL

## 2013-05-15 MED ORDER — METRONIDAZOLE 500 MG PO TABS: 500.0000 mg | ORAL_TABLET | Freq: Two times a day (BID) | ORAL | Status: DC

## 2013-05-15 MED ORDER — CEFTRIAXONE SODIUM 250 MG IJ SOLR
250.0000 mg | Freq: Once | INTRAMUSCULAR | Status: AC
  Administered 2013-05-15: 250 mg via INTRAMUSCULAR
  Filled 2013-05-15: qty 250

## 2013-05-15 MED ORDER — HYDROCODONE-ACETAMINOPHEN 5-325 MG PO TABS: 1.0000 | ORAL_TABLET | ORAL | Status: DC | PRN

## 2013-05-15 MED ORDER — AZITHROMYCIN 1 G PO PACK
1.0000 g | PACK | Freq: Once | ORAL | Status: AC
Start: 2013-05-15 — End: 2013-05-15
  Administered 2013-05-15: 1 g via ORAL
  Filled 2013-05-15: qty 1

## 2013-05-15 MED ORDER — HYDROCODONE-ACETAMINOPHEN 5-325 MG PO TABS
1.0000 | ORAL_TABLET | Freq: Once | ORAL | Status: AC
  Administered 2013-05-15: 1 via ORAL
  Filled 2013-05-15: qty 1

## 2013-05-15 NOTE — Progress Notes (Signed)
   CARE MANAGEMENT ED NOTE 05/15/2013  Patient:  GREER, WAINRIGHT   Account Number:  [id number]  Date Initiated:  05/15/2013  Documentation initiated by:  Radford Pax  Subjective/Objective Assessment:   Patient presents to ED with right flank pain.     Subjective/Objective Assessment Detail:     Action/Plan:   Action/Plan Detail:   Anticipated DC Date:       Status Recommendation to Physician:   Result of Recommendation:    Other ED Services  Consult Working Plan    DC Planning Services  Other  PCP issues    Choice offered to / List presented to:            Status of service:  Completed, signed off  ED Comments:   ED Comments Detail:  EDCM saw this patient on 05/11/2013.  EDCM provided this patient list of pcps who accept medicaid insurance. Patient reports taht she still has this information. Patient reports that she saw one of the doctors but did not have a good experience.  EDCM encouraged patient to find a pcp she is happy with and stressed importance of having a pcp.  Patient verbalized understanding.

## 2013-05-15 NOTE — ED Provider Notes (Signed)
History    CSN: [id number] Arrival date & time 05/15/13  1007  First MD Initiated Contact with Patient 05/15/13 1109     Chief Complaint  Patient presents with  . Nephrolithiasis   (Consider location/radiation/quality/duration/timing/severity/associated sxs/prior Treatment) HPI Comments: 42 yo female presenting with right flank and RLQ abdominal pain which started this morning.  She was recently evaluated in the ED and had a CT scan which showed bilateral nephrolithiasis and possible distal ureteral stone.  Since leaving the ED, she states she was feeling much better until this morning when her pain returned . She describes a pressure like, cramping pain which is now located mostly in her right pelvic/suprapubic region.  Whereas last week, her pain was more low right back.  Her pain medications are still helping, but she returned to ED today because her pain returned.  She was unable to follow up with a Urologist as intended.    Patient is a 42 y.o. female presenting with abdominal pain.  Abdominal Pain This is a recurrent problem. The current episode started 6 to 12 hours ago. The problem occurs constantly. The problem has not changed (now severe) since onset.Associated symptoms include abdominal pain. Pertinent negatives include no chest pain and no shortness of breath. The symptoms are aggravated by twisting. The symptoms are relieved by medications. Treatments tried: norco. The treatment provided significant relief.   Past Medical History  Diagnosis Date  . Migraines   . Seizure   . Ectopic pregnancy   . UTI (lower urinary tract infection)    Past Surgical History  Procedure Laterality Date  . Ectopic pregnancy surgery    . Tubal ligation    . Wisdom tooth extraction     Family History  Problem Relation Age of Onset  . Cancer Maternal Aunt   . Cancer Maternal Grandmother   . Cancer Maternal Grandfather    History  Substance Use Topics  . Smoking status: Current Every Day  Smoker -- 0.50 packs/day    Types: Cigarettes  . Smokeless tobacco: Not on file  . Alcohol Use: No     Comment: socially   OB History   Grav Para Term Preterm Abortions TAB SAB Ect Mult Living   5 4 4  1   1  4      Review of Systems  Constitutional: Negative for fever.  HENT: Negative for congestion.   Respiratory: Negative for cough and shortness of breath.   Cardiovascular: Negative for chest pain.  Gastrointestinal: Positive for abdominal pain. Negative for nausea, vomiting and diarrhea.  All other systems reviewed and are negative.    Allergies  Sulfur and Aspartame and phenylalanine  Home Medications   Current Outpatient Rx  Name  Route  Sig  Dispense  Refill  . ciprofloxacin (CIPRO) 500 MG tablet   Oral   Take 1 tablet (500 mg total) by mouth 2 (two) times daily.   6 tablet   0   . HYDROcodone-acetaminophen (NORCO/VICODIN) 5-325 MG per tablet   Oral   Take 1 tablet by mouth every 4 (four) hours as needed for pain.   15 tablet   0   . ibuprofen (ADVIL,MOTRIN) 200 MG tablet   Oral   Take 400 mg by mouth every 6 (six) hours as needed for pain.         Marland Kitchen ondansetron (ZOFRAN) 4 MG tablet   Oral   Take 1 tablet (4 mg total) by mouth every 6 (six) hours.   12 tablet  0   . tamsulosin (FLOMAX) 0.4 MG CAPS   Oral   Take 1 capsule (0.4 mg total) by mouth daily.   10 capsule   0    BP 109/79  Pulse 71  Temp(Src) 98.7 F (37.1 C) (Oral)  Resp 16  Ht 5\' 8"  (1.727 m)  Wt 140 lb (63.504 kg)  BMI 21.29 kg/m2  SpO2 98%  LMP 04/20/2013 Physical Exam  Nursing note and vitals reviewed. Constitutional: She is oriented to person, place, and time. She appears well-developed and well-nourished. No distress.  HENT:  Head: Normocephalic and atraumatic.  Mouth/Throat: Oropharynx is clear and moist.  Eyes: Conjunctivae are normal. Pupils are equal, round, and reactive to light. No scleral icterus.  Neck: Neck supple.  Cardiovascular: Normal rate, regular  rhythm, normal heart sounds and intact distal pulses.   No murmur heard. Pulmonary/Chest: Effort normal and breath sounds normal. No stridor. No respiratory distress. She has no rales.  Abdominal: Soft. Bowel sounds are normal. She exhibits no distension. There is tenderness in the right lower quadrant and suprapubic area. There is CVA tenderness (right). There is no rigidity, no rebound and no guarding.  Genitourinary: There is no rash, tenderness or lesion on the right labia. There is no rash, tenderness or lesion on the left labia. Cervix exhibits motion tenderness (mild), discharge and friability. No tenderness around the vagina. No vaginal discharge found.  nonfocal pelvic tenderness without masses or fullness  Musculoskeletal: Normal range of motion.  Neurological: She is alert and oriented to person, place, and time.  Skin: Skin is warm and dry. No rash noted.  Psychiatric: She has a normal mood and affect. Her behavior is normal.    ED Course  Procedures (including critical care time) Labs Reviewed  WET PREP, GENITAL - Abnormal; Notable for the following:    Clue Cells Wet Prep HPF POC FEW (*)    WBC, Wet Prep HPF POC FEW (*)    All other components within normal limits  URINALYSIS, ROUTINE W REFLEX MICROSCOPIC - Abnormal; Notable for the following:    APPearance CLOUDY (*)    Leukocytes, UA MODERATE (*)    All other components within normal limits  URINE MICROSCOPIC-ADD ON - Abnormal; Notable for the following:    Squamous Epithelial / LPF MANY (*)    Bacteria, UA MANY (*)    All other components within normal limits  URINE CULTURE  GC/CHLAMYDIA PROBE AMP  POCT PREGNANCY, URINE   No results found. 1. Cervicitis   2. Abdominal pain     MDM  42 yo female with flank and abdominal pain . Symptoms for past 5 days, but pain controlled with PO narcotics at home.  She completed a course of cipro for a UTI since her last visit.  She was treated for a possible ureteral stone based  on CT imaging last week and told to follow up with Urology.  On exam today, she does not appear in significant distress and her pain is more abdominal and pelvic.  Pelvic exam revealed mild diffuse tenderness as well as friability and discharge.  She admits to multiple sexual partners.  Her urinalysis does not show signs of infection or blood.  Her symptoms are not c/w kidney stone on this presentation, but I am concerned about possible STD.  She was treated with Ceftriaxone IM and azithromycin.  Her abdominal exam and pelvic exam were not c/w appendicitis, ov torsion, or TOA.  However, she was given return precautions for worsening  or changing symptoms.  I do not think repeat CT imaging or US imaging is warranted given today's presentation.    Candyce Churn, MD 05/15/13 [phone]

## 2013-05-15 NOTE — ED Notes (Signed)
Per pt, right flank pain-was treated last week for same symptoms, went to follow up with urology but they refused to see her because she did not have a co-pay

## 2013-05-16 LAB — GC/CHLAMYDIA PROBE AMP
CT Probe RNA: NEGATIVE
GC Probe RNA: NEGATIVE

## 2013-05-17 LAB — URINE CULTURE: Colony Count: 3000

## 2013-07-14 ENCOUNTER — Emergency Department (HOSPITAL_COMMUNITY)
Admission: EM | Admit: 2013-07-14 | Discharge: 2013-07-14 | Disposition: A | Discharge status: Home / Self Care | Payer: Medicaid Other | Source: Home / Self Care | Attending: Family Medicine | Admitting: Family Medicine

## 2013-07-14 ENCOUNTER — Encounter (HOSPITAL_COMMUNITY): Payer: Self-pay | Admitting: Emergency Medicine

## 2013-07-14 DIAGNOSIS — B354 Tinea corporis: Secondary | ICD-10-CM

## 2013-07-14 MED ORDER — TERBINAFINE HCL 1 % EX CREA: TOPICAL_CREAM | Freq: Two times a day (BID) | CUTANEOUS | Status: DC

## 2013-07-14 NOTE — ED Provider Notes (Signed)
Medical screening examination/treatment/procedure(s) were performed by resident physician or non-physician practitioner and as supervising physician I was immediately available for consultation/collaboration.   Editha Bridgeforth DOUGLAS MD.   Rosamary Boudreau D Jezebel Pollet, MD 07/14/13 1431 

## 2013-07-14 NOTE — ED Notes (Signed)
Circular rash to left forearm.  Reports being noticed 1 1/2 weeks ago.  Patient remains on cell phone.

## 2013-07-14 NOTE — ED Provider Notes (Signed)
CSN: [id number]     Arrival date & time 07/14/13  1215 History   First MD Initiated Contact with Patient 07/14/13 1254     Chief Complaint  Patient presents with  . Rash   (Consider location/radiation/quality/duration/timing/severity/associated sxs/prior Treatment) Patient is a 42 y.o. female presenting with rash. The history is provided by the patient.  Rash Pain location: L anterior lower arm. Pain severity:  No pain Onset quality: Patient was bitten "by something" on L arm while sitting on her front portch on Sunday afternoon.  Patient staetes it was not painful, but immediately itchiy and she noticed "3 little bites".   Duration:  5 days Progression:  Worsening Relieved by: Patient has tried her daughter's triamcinolone cream on it and it has continued to worsen. Associated symptoms: no chills, no diarrhea, no dysuria, no fatigue, no fever, no nausea, no shortness of breath and no vomiting     Past Medical History  Diagnosis Date  . Migraines   . Seizure   . Ectopic pregnancy   . UTI (lower urinary tract infection)    Past Surgical History  Procedure Laterality Date  . Ectopic pregnancy surgery    . Tubal ligation    . Wisdom tooth extraction     Family History  Problem Relation Age of Onset  . Cancer Maternal Aunt   . Cancer Maternal Grandmother   . Cancer Maternal Grandfather    History  Substance Use Topics  . Smoking status: Current Every Day Smoker -- 0.50 packs/day    Types: Cigarettes  . Smokeless tobacco: Not on file  . Alcohol Use: No     Comment: socially   OB History   Grav Para Term Preterm Abortions TAB SAB Ect Mult Living   5 4 4  1   1  4      Review of Systems  Constitutional: Negative for fever, chills and fatigue.       No malaise since bite.  HENT: Negative.   Eyes: Negative.   Respiratory: Negative.  Negative for shortness of breath.   Cardiovascular: Negative.   Gastrointestinal: Negative.  Negative for nausea, vomiting and diarrhea.    Endocrine: Negative.   Genitourinary: Negative.  Negative for dysuria.  Skin: Positive for rash.       Denies any pain with bite.  Denies scratching, but states it is very itchy.  Allergic/Immunologic:       History of allergy to bees and states swells up "horribly" with mosquito bites.  Neurological: Negative.   Hematological: Negative.   Psychiatric/Behavioral: Negative.     Allergies  Sulfur and Aspartame and phenylalanine  Home Medications   Current Outpatient Rx  Name  Route  Sig  Dispense  Refill  . ciprofloxacin (CIPRO) 500 MG tablet   Oral   Take 1 tablet (500 mg total) by mouth 2 (two) times daily.   6 tablet   0   . HYDROcodone-acetaminophen (NORCO/VICODIN) 5-325 MG per tablet   Oral   Take 1 tablet by mouth every 4 (four) hours as needed for pain.   15 tablet   0   . HYDROcodone-acetaminophen (NORCO/VICODIN) 5-325 MG per tablet   Oral   Take 1 tablet by mouth every 4 (four) hours as needed for pain.   10 tablet   0   . ibuprofen (ADVIL,MOTRIN) 200 MG tablet   Oral   Take 400 mg by mouth every 6 (six) hours as needed for pain.         Marland Kitchen  metroNIDAZOLE (FLAGYL) 500 MG tablet   Oral   Take 1 tablet (500 mg total) by mouth 2 (two) times daily.   14 tablet   0   . ondansetron (ZOFRAN) 4 MG tablet   Oral   Take 1 tablet (4 mg total) by mouth every 6 (six) hours.   12 tablet   0   . tamsulosin (FLOMAX) 0.4 MG CAPS   Oral   Take 1 capsule (0.4 mg total) by mouth daily.   10 capsule   0   . terbinafine (LAMISIL) 1 % cream   Topical   Apply topically 2 (two) times daily. Use for at least 14 days or until rash has resolved.   30 g   0    BP 115/76  Pulse 64  Temp(Src) 97.1 F (36.2 C) (Oral)  Resp 16  SpO2 100% Physical Exam  Nursing note and vitals reviewed. Constitutional: She is oriented to person, place, and time. She appears well-developed and well-nourished. No distress.  HENT:  Head: Normocephalic and atraumatic.  Neck: Normal  range of motion. Neck supple.  Cardiovascular: Normal rate, regular rhythm, normal heart sounds and intact distal pulses.  Exam reveals no gallop and no friction rub.   No murmur heard. Pulmonary/Chest: Effort normal and breath sounds normal. No respiratory distress. She has no wheezes. She has no rales. She exhibits no tenderness.  Lymphadenopathy:    She has no cervical adenopathy.  Neurological: She is alert and oriented to person, place, and time.  Skin: Skin is warm and dry. Rash noted. Rash is urticarial. She is not diaphoretic. No pallor.     Psychiatric: She has a normal mood and affect. Her behavior is normal.    ED Course  Procedures (including critical care time) Labs Review Labs Reviewed - No data to display Imaging Review No results found.  MDM   1. Ringworm of body    Meds ordered this encounter  Medications  . terbinafine (LAMISIL) 1 % cream    Sig: Apply topically 2 (two) times daily. Use for at least 14 days or until rash has resolved.    Dispense:  30 g    Refill:  0    Order Specific Question:  Supervising Provider    Answer:  Linna Hoff [phone]    Discussed with patient to avoid the use of steroid creams as these can aggravate a fungal infection.  Patient advised to use lamisil cream for next 10-14 days until rash resolves.  Patient to see PCP or return here if no improvement or failure to resolve.  Discussed the need to follow up with primary care provider for testing of tick bourne illness if any development of constitutional symptoms, headache or malaise.     Weber Cooks, NP 07/14/13 1332

## 2013-07-16 ENCOUNTER — Encounter (HOSPITAL_COMMUNITY): Payer: Self-pay | Admitting: Emergency Medicine

## 2013-07-16 ENCOUNTER — Emergency Department (HOSPITAL_COMMUNITY)
Admission: EM | Admit: 2013-07-16 | Discharge: 2013-07-16 | Disposition: A | Discharge status: Home / Self Care | Payer: Medicaid Other | Attending: Emergency Medicine | Admitting: Emergency Medicine

## 2013-07-16 DIAGNOSIS — F17200 Nicotine dependence, unspecified, uncomplicated: Secondary | ICD-10-CM | POA: Insufficient documentation

## 2013-07-16 DIAGNOSIS — Z792 Long term (current) use of antibiotics: Secondary | ICD-10-CM | POA: Insufficient documentation

## 2013-07-16 DIAGNOSIS — Z8669 Personal history of other diseases of the nervous system and sense organs: Secondary | ICD-10-CM | POA: Insufficient documentation

## 2013-07-16 DIAGNOSIS — R5381 Other malaise: Secondary | ICD-10-CM | POA: Insufficient documentation

## 2013-07-16 DIAGNOSIS — R110 Nausea: Secondary | ICD-10-CM | POA: Insufficient documentation

## 2013-07-16 DIAGNOSIS — R21 Rash and other nonspecific skin eruption: Secondary | ICD-10-CM | POA: Insufficient documentation

## 2013-07-16 DIAGNOSIS — Z87440 Personal history of urinary (tract) infections: Secondary | ICD-10-CM | POA: Insufficient documentation

## 2013-07-16 DIAGNOSIS — Z8679 Personal history of other diseases of the circulatory system: Secondary | ICD-10-CM | POA: Insufficient documentation

## 2013-07-16 DIAGNOSIS — Z79899 Other long term (current) drug therapy: Secondary | ICD-10-CM | POA: Insufficient documentation

## 2013-07-16 MED ORDER — DOXYCYCLINE HYCLATE 100 MG PO CAPS: 100.0000 mg | ORAL_CAPSULE | Freq: Two times a day (BID) | ORAL | Status: AC

## 2013-07-16 NOTE — ED Notes (Signed)
Round red rash on l/forearm, increased over three weeks. Seen at Urgent Care and treated for ringworm-no change in rash

## 2013-07-16 NOTE — ED Provider Notes (Signed)
Medical screening examination/treatment/procedure(s) were conducted as a shared visit with non-physician practitioner(s) and myself.  I personally evaluated the patient during the encounter  Patient with "bullseye" rash, subjective fevers and myalgias.  Possible tick exposure.  Treated for ringworm and using Lamisil but rash worsening.  Target like rash of L forearm with central clearing.  Dry flaking noted.  Patient reports rash is more dry since using Lamicil.  GIven systemic symptoms and tick exposure, will treat for tick borne illness with 2 weeks of doxycycline.  Follow-up with PCP.  [name], MD 07/16/13 [phone]

## 2013-07-16 NOTE — ED Provider Notes (Signed)
CSN: [id number]     Arrival date & time 07/16/13  1348 History   First MD Initiated Contact with Patient 07/16/13 1403     Chief Complaint  Patient presents with  . Rash    follow up evaluation for red raised rash on l/forearm.    (Consider location/radiation/quality/duration/timing/severity/associated sxs/prior Treatment) Patient is a 42 y.o. female presenting with rash. The history is provided by the patient. No language interpreter was used.  Rash Location:  Shoulder/arm Shoulder/arm rash location:  L forearm Pt is a 42 year old female who has a history of a "bullseye" rash on her left forearm. She reports that this started approx 3weeks ago after seeing an initial area that looked like a "bite". 3-4 days after the initial "bite" she began having general malaise and aches. 5-7 days after the "bite" a bulls eye rash appeared. She was seen at an urgent care 2 days ago and given Lamisil cream for ring worm and it has not helped. She reports nausea for approx 2 weeks and feels like she may have had fever a few days after the "bite". She denies seeing an insect or tick on her [place] They have a dog who has had some ticks attached this year.  Past Medical History  Diagnosis Date  . Migraines   . Seizure   . Ectopic pregnancy   . UTI (lower urinary tract infection)    Past Surgical History  Procedure Laterality Date  . Ectopic pregnancy surgery    . Tubal ligation    . Wisdom tooth extraction     Family History  Problem Relation Age of Onset  . Cancer Maternal Aunt   . Cancer Maternal Grandmother   . Cancer Maternal Grandfather    History  Substance Use Topics  . Smoking status: Current Every Day Smoker -- 0.50 packs/day    Types: Cigarettes  . Smokeless tobacco: Not on file  . Alcohol Use: No     Comment: socially   OB History   Grav Para Term Preterm Abortions TAB SAB Ect Mult Living   5 4 4  1   1  4      Review of Systems  Skin: Positive for rash.     Allergies  Sulfur and Aspartame and phenylalanine  Home Medications   Current Outpatient Rx  Name  Route  Sig  Dispense  Refill  . ciprofloxacin (CIPRO) 500 MG tablet   Oral   Take 1 tablet (500 mg total) by mouth 2 (two) times daily.   6 tablet   0   . HYDROcodone-acetaminophen (NORCO/VICODIN) 5-325 MG per tablet   Oral   Take 1 tablet by mouth every 4 (four) hours as needed for pain.   15 tablet   0   . HYDROcodone-acetaminophen (NORCO/VICODIN) 5-325 MG per tablet   Oral   Take 1 tablet by mouth every 4 (four) hours as needed for pain.   10 tablet   0   . ibuprofen (ADVIL,MOTRIN) 200 MG tablet   Oral   Take 400 mg by mouth every 6 (six) hours as needed for pain.         . metroNIDAZOLE (FLAGYL) 500 MG tablet   Oral   Take 1 tablet (500 mg total) by mouth 2 (two) times daily.   14 tablet   0   . ondansetron (ZOFRAN) 4 MG tablet   Oral   Take 1 tablet (4 mg total) by mouth every 6 (six)  hours.   12 tablet   0   . tamsulosin (FLOMAX) 0.4 MG CAPS   Oral   Take 1 capsule (0.4 mg total) by mouth daily.   10 capsule   0   . terbinafine (LAMISIL) 1 % cream   Topical   Apply topically 2 (two) times daily. Use for at least 14 days or until rash has resolved.   30 g   0    There were no vitals taken for this visit. Physical Exam  Nursing note and vitals reviewed. Constitutional: She is oriented to person, place, and time. She appears well-developed and well-nourished. No distress.  HENT:  Head: Normocephalic and atraumatic.  Right Ear: External ear normal.  Left Ear: External ear normal.  Mouth/Throat: Oropharynx is clear and moist.  Eyes: Pupils are equal, round, and reactive to light.  Neck: Normal range of motion. Neck supple.  Cardiovascular: Normal rate, regular rhythm, normal heart sounds and intact distal pulses.   Pulmonary/Chest: Effort normal and breath sounds normal.  Abdominal: Soft. Bowel sounds are normal.  Musculoskeletal: Normal range  of motion.  Neurological: She is alert and oriented to person, place, and time.  Skin: Skin is warm and dry.     Bulls eye rash left forearm. Annular, erythematous rash with central purpura measuring approx 5 cm in diameter.  Psychiatric: She has a normal mood and affect. Her behavior is normal. Judgment and thought content normal.    ED Course  Procedures (including critical care time) Labs Review Labs Reviewed - No data to display Imaging Review No results found.  MDM   1. Localized rash     PLAN Bulls eye rash, left forearm with history of malaise, aches, headache and probable low grade fever.  Treat empirically for Lyme disease, no titers drawn. Discussed with Dr. Wilkie Aye and agreed for empiric tx. Take Doxycycline as prescribed. Return if symptoms worsen or if fever continues. Follow up with PCP.    Irish Elders, NP 07/16/13 [phone]

## 2014-01-30 ENCOUNTER — Emergency Department (HOSPITAL_COMMUNITY): Payer: Medicaid Other

## 2014-01-30 ENCOUNTER — Encounter (HOSPITAL_COMMUNITY): Payer: Self-pay | Admitting: Emergency Medicine

## 2014-01-30 ENCOUNTER — Emergency Department (HOSPITAL_COMMUNITY)
Admission: EM | Admit: 2014-01-30 | Discharge: 2014-01-30 | Disposition: A | Discharge status: Home / Self Care | Payer: Medicaid Other | Attending: Emergency Medicine | Admitting: Emergency Medicine

## 2014-01-30 DIAGNOSIS — S60222A Contusion of left hand, initial encounter: Secondary | ICD-10-CM

## 2014-01-30 DIAGNOSIS — Z87442 Personal history of urinary calculi: Secondary | ICD-10-CM | POA: Insufficient documentation

## 2014-01-30 DIAGNOSIS — W2209XA Striking against other stationary object, initial encounter: Secondary | ICD-10-CM | POA: Insufficient documentation

## 2014-01-30 DIAGNOSIS — Y929 Unspecified place or not applicable: Secondary | ICD-10-CM | POA: Insufficient documentation

## 2014-01-30 DIAGNOSIS — Z8669 Personal history of other diseases of the nervous system and sense organs: Secondary | ICD-10-CM | POA: Insufficient documentation

## 2014-01-30 DIAGNOSIS — Y9383 Activity, rough housing and horseplay: Secondary | ICD-10-CM | POA: Insufficient documentation

## 2014-01-30 DIAGNOSIS — Z8679 Personal history of other diseases of the circulatory system: Secondary | ICD-10-CM | POA: Insufficient documentation

## 2014-01-30 DIAGNOSIS — Z87440 Personal history of urinary (tract) infections: Secondary | ICD-10-CM | POA: Insufficient documentation

## 2014-01-30 DIAGNOSIS — F17200 Nicotine dependence, unspecified, uncomplicated: Secondary | ICD-10-CM | POA: Insufficient documentation

## 2014-01-30 DIAGNOSIS — S60229A Contusion of unspecified hand, initial encounter: Secondary | ICD-10-CM | POA: Insufficient documentation

## 2014-01-30 HISTORY — DX: Calculus of kidney: N20.0

## 2014-01-30 MED ORDER — HYDROCODONE-ACETAMINOPHEN 5-325 MG PO TABS
1.0000 | ORAL_TABLET | Freq: Once | ORAL | Status: AC
  Administered 2014-01-30: 1 via ORAL
  Filled 2014-01-30: qty 1

## 2014-01-30 MED ORDER — NAPROXEN 500 MG PO TABS: 500.0000 mg | ORAL_TABLET | Freq: Two times a day (BID) | ORAL | Status: DC

## 2014-01-30 NOTE — Discharge Instructions (Signed)
Keep your hand wrapped with an Ace wrap to limit swelling. Apply ice as indicated below. Take Naproxen for pain control. Followup with a hand specialist if symptoms do not improve despite recommended treatment within one week.  Hand Contusion A hand contusion is a deep bruise on your hand area. Contusions are the result of an injury that caused bleeding under the skin. The contusion may turn blue, purple, or yellow. Minor injuries will give you a painless contusion, but more severe contusions may stay painful and swollen for a few weeks. CAUSES  A contusion is usually caused by a blow, trauma, or direct force to an area of the body. SYMPTOMS   Swelling and redness of the injured area.  Discoloration of the injured area.  Tenderness and soreness of the injured area.  Pain. DIAGNOSIS  The diagnosis can be made by taking a history and performing a physical exam. An X-ray, CT scan, or MRI may be needed to determine if there were any associated injuries, such as broken bones (fractures). TREATMENT  Often, the best treatment for a hand contusion is resting, elevating, icing, and applying cold compresses to the injured area. Over-the-counter medicines may also be recommended for pain control. HOME CARE INSTRUCTIONS   Put ice on the injured area.  Put ice in a plastic bag.  Place a towel between your skin and the bag.  Leave the ice on for 15-20 minutes, 03-04 times a day.  Only take over-the-counter or prescription medicines as directed by your caregiver. Your caregiver may recommend avoiding anti-inflammatory medicines (aspirin, ibuprofen, and naproxen) for 48 hours because these medicines may increase bruising.  If told, use an elastic wrap as directed. This can help reduce swelling. You may remove the wrap for sleeping, showering, and bathing. If your fingers become numb, cold, or blue, take the wrap off and reapply it more loosely.  Elevate your hand with pillows to reduce  swelling.  Avoid overusing your hand if it is painful. SEEK IMMEDIATE MEDICAL CARE IF:   You have increased redness, swelling, or pain in your hand.  Your swelling or pain is not relieved with medicines.  You have loss of feeling in your hand or are unable to move your fingers.  Your hand turns cold or blue.  You have pain when you move your fingers.  Your hand becomes warm to the touch.  Your contusion does not improve in 2 days. MAKE SURE YOU:   Understand these instructions.  Will watch your condition.  Will get help right away if you are not doing well or get worse. Document Released: 04/10/2002 Document Revised: 07/13/2012 Document Reviewed: 04/11/2012 [facility] Patient Information 2014 [employer], [place].

## 2014-01-30 NOTE — ED Provider Notes (Signed)
Medical screening examination/treatment/procedure(s) were conducted as a shared visit with non-physician practitioner(s) or resident  and myself.  I personally evaluated the patient during the encounter and agree with the findings and plan unless otherwise indicated.    I have personally reviewed any xrays and/ or EKG's with the provider and I agree with interpretation.   Left hand injury 5 hours prior to arrival while playing with her son. Pain with palpation range of motion of lateral fingers on the left hand. Sam tender small finger PIP and ring finger MCP and PIP, pain with palpation and flexion. No open wounds, neurovascularly intact distal to the injuries.  X-ray no acute fracture. Followup outpatient.  Left hand injury  Jasmine Christian Overall, MD 01/30/14 [phone]

## 2014-01-30 NOTE — ED Notes (Signed)
Pt complaining that she didn't get anything for pain here and that no one is listening to her that her tendons are hurting.  SPoke with patient about needing to follow up with a specialist.  PA made aware.  Once patients driver is verified pain meds will be given.

## 2014-01-30 NOTE — ED Provider Notes (Signed)
CSN: [id number]     Arrival date & time 01/30/14  0138 History   First MD Initiated Contact with Patient 01/30/14 0239     Chief Complaint  Patient presents with  . Hand Injury    (Consider location/radiation/quality/duration/timing/severity/associated sxs/prior Treatment) Patient is a 43 y.o. female presenting with hand injury. The history is provided by the patient. No language interpreter was used.  Hand Injury Location:  Hand Time since incident:  5 hours Injury: yes   Hand location:  L hand Pain details:    Quality:  Aching, throbbing, burning and tingling   Radiates to:  R wrist   Severity:  Mild   Onset quality:  Sudden   Timing:  Constant   Progression:  Worsening Chronicity:  New Dislocation: no   Prior injury to area:  No Relieved by:  Ice (mild relief) Worsened by:  Movement (palpation to L hand and fingers) Associated symptoms: decreased range of motion (secondary to pain), swelling and tingling   Associated symptoms: no fever, no muscle weakness and no numbness   Risk factors: no known bone disorder, no frequent fractures and no recent illness     Past Medical History  Diagnosis Date  . Migraines   . Seizure   . Ectopic pregnancy   . UTI (lower urinary tract infection)   . Kidney stones    Past Surgical History  Procedure Laterality Date  . Ectopic pregnancy surgery    . Wisdom tooth extraction     Family History  Problem Relation Age of Onset  . Cancer Maternal Aunt   . Cancer Maternal Grandmother   . Cancer Maternal Grandfather   . Diabetes Other    History  Substance Use Topics  . Smoking status: Current Every Day Smoker -- 0.50 packs/day    Types: Cigarettes  . Smokeless tobacco: Not on file  . Alcohol Use: Yes     Comment: socially   OB History   Grav Para Term Preterm Abortions TAB SAB Ect Mult Living   5 4 4  1   1  4      Review of Systems  Constitutional: Negative for fever.  Musculoskeletal: Positive for arthralgias, joint  swelling and myalgias.  Skin: Negative for pallor.  Neurological: Negative for weakness and numbness.  All other systems reviewed and are negative.     Allergies  Sulfur and Aspartame and phenylalanine  Home Medications   Current Outpatient Rx  Name  Route  Sig  Dispense  Refill  . ibuprofen (ADVIL,MOTRIN) 200 MG tablet   Oral   Take 800 mg by mouth every 6 (six) hours as needed (pain).         . naproxen (NAPROSYN) 500 MG tablet   Oral   Take 1 tablet (500 mg total) by mouth 2 (two) times daily.   30 tablet   0    BP 115/78  Pulse 86  Temp(Src) 98.1 F (36.7 C) (Oral)  Resp 18  Ht 5\' 6"  (1.676 m)  Wt 135 lb (61.236 kg)  BMI 21.80 kg/m2  SpO2 98%  LMP 01/20/2014  Physical Exam  Nursing note and vitals reviewed. Constitutional: She is oriented to person, place, and time. She appears well-developed and well-nourished. No distress.  HENT:  Head: Normocephalic and atraumatic.  Eyes: Conjunctivae and EOM are normal. No scleral icterus.  Neck: Normal range of motion.  Cardiovascular: Normal rate, regular rhythm and intact distal pulses.   Distal radial pulse 2+ and left upper extremity. Capillary refill  normal in all digits of left hand.  Pulmonary/Chest: Effort normal. No respiratory distress.  Musculoskeletal: She exhibits tenderness.       Left wrist: Normal.       Left hand: She exhibits decreased range of motion, tenderness, bony tenderness and swelling. She exhibits normal two-point discrimination, normal capillary refill and no deformity. Normal sensation noted.  TTP of proximal 3rd-5th digits; primary TTP at 3rd and 4th PIP joints. Decreased ROM secondary to pain. Mild associated swelling.  Neurological: She is alert and oriented to person, place, and time.  No gross sensory deficits appreciated. Negative thumb opposition intact in L hand.  Skin: Skin is warm and dry. No rash noted. She is not diaphoretic. No erythema. No pallor.  Psychiatric: She has a  normal mood and affect. Her behavior is normal.    ED Course  Procedures (including critical care time) Labs Review Labs Reviewed - No data to display  Imaging Review Dg Hand Complete Left  01/30/2014   CLINICAL DATA:  Punched wall, hand pain  EXAM: LEFT HAND - COMPLETE 3+ VIEW  COMPARISON:  None.  FINDINGS: There is no evidence of fracture or dislocation. There is no evidence of arthropathy or other focal bone abnormality. Soft tissues are unremarkable.  IMPRESSION: Negative.   Electronically Signed   By: Malachy MoanHeath  McCullough M.D.   On: 01/30/2014 02:55     EKG Interpretation None      MDM   Final diagnoses:  Contusion of left hand    Uncomplicated contusion of left hand. Patient neurovascularly intact. No gross sensory deficits appreciated. Symptoms secondary to patient hitting the back of her hand on a wall while horsing around with her son. X-ray negative for fracture, dislocation, or bony deformity, by my interpretation. Patient placed in Ace wrap to limit swelling. RICE and Naproxen advised. Return precautions discussed and hand specialist followup provided should symptoms not begin to improve with recommended treatment within one week. Patient agreeable to plan with no unaddressed concerns.   Filed Vitals:   01/30/14 0159  BP: 115/78  Pulse: 86  Temp: 98.1 F (36.7 C)  TempSrc: Oral  Resp: 18  Height: 5\' 6"  (1.676 m)  Weight: 135 lb (61.236 kg)  SpO2: 98%     Antony MaduraKelly Marris Frontera, PA-C 01/30/14 0335

## 2014-01-30 NOTE — ED Notes (Signed)
Pt states she was horsing around with her son and slapped the wall instead of him  Pt has swelling noted to her left hand

## 2014-09-03 ENCOUNTER — Encounter (HOSPITAL_COMMUNITY): Payer: Self-pay | Admitting: Emergency Medicine

## 2015-01-18 ENCOUNTER — Encounter (HOSPITAL_COMMUNITY): Payer: Self-pay

## 2015-01-18 ENCOUNTER — Emergency Department (HOSPITAL_COMMUNITY): Payer: Medicaid Other

## 2015-01-18 ENCOUNTER — Emergency Department (HOSPITAL_COMMUNITY)
Admission: EM | Admit: 2015-01-18 | Discharge: 2015-01-18 | Disposition: A | Discharge status: Home / Self Care | Payer: Medicaid Other | Attending: Emergency Medicine | Admitting: Emergency Medicine

## 2015-01-18 DIAGNOSIS — Z87442 Personal history of urinary calculi: Secondary | ICD-10-CM | POA: Insufficient documentation

## 2015-01-18 DIAGNOSIS — W19XXXA Unspecified fall, initial encounter: Secondary | ICD-10-CM

## 2015-01-18 DIAGNOSIS — Y998 Other external cause status: Secondary | ICD-10-CM | POA: Diagnosis not present

## 2015-01-18 DIAGNOSIS — Z8679 Personal history of other diseases of the circulatory system: Secondary | ICD-10-CM | POA: Diagnosis not present

## 2015-01-18 DIAGNOSIS — X58XXXA Exposure to other specified factors, initial encounter: Secondary | ICD-10-CM | POA: Insufficient documentation

## 2015-01-18 DIAGNOSIS — Z87440 Personal history of urinary (tract) infections: Secondary | ICD-10-CM | POA: Diagnosis not present

## 2015-01-18 DIAGNOSIS — Y9289 Other specified places as the place of occurrence of the external cause: Secondary | ICD-10-CM | POA: Diagnosis not present

## 2015-01-18 DIAGNOSIS — Z720 Tobacco use: Secondary | ICD-10-CM | POA: Insufficient documentation

## 2015-01-18 DIAGNOSIS — S92212A Displaced fracture of cuboid bone of left foot, initial encounter for closed fracture: Secondary | ICD-10-CM | POA: Insufficient documentation

## 2015-01-18 DIAGNOSIS — S99912A Unspecified injury of left ankle, initial encounter: Secondary | ICD-10-CM | POA: Diagnosis present

## 2015-01-18 DIAGNOSIS — Y9389 Activity, other specified: Secondary | ICD-10-CM | POA: Diagnosis not present

## 2015-01-18 DIAGNOSIS — S8992XA Unspecified injury of left lower leg, initial encounter: Secondary | ICD-10-CM | POA: Diagnosis not present

## 2015-01-18 MED ORDER — HYDROCODONE-ACETAMINOPHEN 5-325 MG PO TABS
2.0000 | ORAL_TABLET | Freq: Once | ORAL | Status: AC
  Administered 2015-01-18: 2 via ORAL
  Filled 2015-01-18: qty 2

## 2015-01-18 MED ORDER — HYDROCODONE-ACETAMINOPHEN 5-325 MG PO TABS: 2.0000 | ORAL_TABLET | ORAL | Status: DC | PRN

## 2015-01-18 NOTE — Discharge Instructions (Signed)
Take vicodin for pain. Rest, ice, and elevate your foot. Follow up with Dr. Eulah PontMurphy for further evaluation and management.

## 2015-01-18 NOTE — Progress Notes (Signed)
Orthopedic Tech Progress Note Patient Details:  Jasmine Christian 01-25-1971 [id number]  Ortho Devices Type of Ortho Device: Crutches, Short leg splint Ortho Device/Splint Interventions: Application   Haskell Flirtewsome, [name] 01/18/2015, 3:57 AM

## 2015-01-18 NOTE — Progress Notes (Signed)
  CARE MANAGEMENT ED NOTE 01/18/2015  Patient:  Larwance RoteMCCLEARY,Renetta D   Account Number:  [id number]  Date Initiated:  01/18/2015  Documentation initiated by:  Radford PaxFERRERO,Angela Vazguez  Subjective/Objective Assessment:   Patient seen in ED in am of 03/18 2016 for ankle and foot pain     Subjective/Objective Assessment Detail:   Xray of foot: 1. Suspect small avulsion fracture at the proximal aspect of the  cuboid.  2. Os peroneum noted.     Action/Plan:   Action/Plan Detail:   Anticipated DC Date:  01/18/2015     Status Recommendation to Physician:   Result of Recommendation:    Other ED Services  Consult Working Plan    DC Planning Services  CM consult  Other    Choice offered to / List presented to:            Status of service:  Completed, signed off  ED Comments:   ED Comments Detail:  [facility] phone call from patient at 1733pm reporting she called to make an appointment to follow up with Dr. Margarita Ranaimothy Murphy but was told she needed to have her primary care doctor make the referral.  Patient also asking what she "Needs to do about my fractured foot over the weekend." [facility] office of Margarita Ranaimothy Murphy, office closed but spoke to [place] on emergency line who reports she will leave a message for the office for Monday.  EDCM informed Marcelle Smilingatasha that patient was discharged from the ED and was referred to Dr. Margarita Ranaimothy Murphy for follow up and needs to make a follow up appointment, [facility] provided phone number for call back.  EDCM reviewed patient's chart.  Patient was discharged with crutches.  Discharge instructions state to rest, ice and elevate foot.  EDCM called patient back at 1813pm on [phone] and left voice mail with above information, left phone number for call back.  No further EDCM needs at this time.

## 2015-01-18 NOTE — ED Notes (Signed)
Ortho tech at bedside 

## 2015-01-18 NOTE — ED Notes (Signed)
Pt fell this afternoon, she complains of bruises on her knee and ankle and foot pain

## 2015-01-18 NOTE — ED Provider Notes (Signed)
CSN: [id number]     Arrival date & time 01/18/15  0022 History   First MD Initiated Contact with Patient 01/18/15 0147     Chief Complaint  Patient presents with  . Ankle Pain     (Consider location/radiation/quality/duration/timing/severity/associated sxs/prior Treatment) HPI Comments: Patient is a 44 year old female who presents with ankle pain that started prior to arrival when she fell while helping a friend move. The mechanism of injury was sudden ankle inversion. Patient reports hearing a "pop" sudden onset of aching, severe pain that is localized to left ankle. Patient reports progressive worsening of pain. Ankle movement and weight bearing activity make the pain worse. Nothing makes the pain better. Patient reports associated swelling. Patient has not tried anything for pain relief. Patient denies obvious deformity, numbness/tingling, coolness/weakness of extremity, bruising, and any other injury. Patient also complains of left knee pain that started after she fell.     Patient is a 44 y.o. female presenting with ankle pain.  Ankle Pain   Past Medical History  Diagnosis Date  . Migraines   . Seizure   . Ectopic pregnancy   . UTI (lower urinary tract infection)   . Kidney stones    Past Surgical History  Procedure Laterality Date  . Ectopic pregnancy surgery    . Wisdom tooth extraction     Family History  Problem Relation Age of Onset  . Cancer Maternal Aunt   . Cancer Maternal Grandmother   . Cancer Maternal Grandfather   . Diabetes Other    History  Substance Use Topics  . Smoking status: Current Every Day Smoker -- 0.50 packs/day    Types: Cigarettes  . Smokeless tobacco: Not on file  . Alcohol Use: Yes     Comment: socially   OB History    Gravida Para Term Preterm AB TAB SAB Ectopic Multiple Living   5 4 4  1   1  4      Review of Systems  Musculoskeletal: Positive for arthralgias.  All other systems reviewed and are negative.     Allergies   Sulfur and Aspartame and phenylalanine  Home Medications   Prior to Admission medications   Medication Sig Start Date End Date Taking? Authorizing Provider  ibuprofen (ADVIL,MOTRIN) 200 MG tablet Take 800 mg by mouth every 6 (six) hours as needed (pain).   Yes Historical Provider, MD  naproxen (NAPROSYN) 500 MG tablet Take 1 tablet (500 mg total) by mouth 2 (two) times daily. Patient not taking: Reported on 01/18/2015 01/30/14   Antony MaduraKelly Humes, PA-C   BP 114/75 mmHg  Pulse 74  Temp(Src) 97.6 F (36.4 C) (Oral)  Resp 18  Ht 5\' 6"  (1.676 m)  Wt 145 lb (65.772 kg)  BMI 23.41 kg/m2  SpO2 100%  LMP 01/10/2015 Physical Exam  Constitutional: She is oriented to person, place, and time. She appears well-developed and well-nourished. No distress.  HENT:  Head: Normocephalic and atraumatic.  Eyes: Conjunctivae and EOM are normal.  Neck: Normal range of motion.  Cardiovascular: Normal rate and regular rhythm.  Exam reveals no gallop and no friction rub.   No murmur heard. Pulmonary/Chest: Effort normal and breath sounds normal. She has no wheezes. She has no rales. She exhibits no tenderness.  Abdominal: Soft. There is no tenderness.  Musculoskeletal: Normal range of motion.  Tenderness to palpation of plantar aspect of left foot along the arch. No obvious deformity. Limited left ankle ROM due to pain. No obvious deformity. Left knee anterior  tenderness to palpation. No obvious deformity.   Neurological: She is alert and oriented to person, place, and time. Coordination normal.  Speech is goal-oriented. Moves limbs without ataxia.   Skin: Skin is warm and dry.  Psychiatric: She has a normal mood and affect. Her behavior is normal.  Nursing note and vitals reviewed.   ED Course  Procedures (including critical care time) Labs Review Labs Reviewed - No data to display  Imaging Review Dg Ankle Complete Left  01/18/2015   CLINICAL DATA:  Twisting injury to left ankle, with lateral malleolar  pain and swelling. Initial encounter.  EXAM: LEFT ANKLE COMPLETE - 3+ VIEW  COMPARISON:  None.  FINDINGS: There is suspicion of a small avulsion fracture involving the proximal aspect of the cuboid. No additional fractures are seen. An os peroneum is noted. The ankle mortise is intact; the interosseous space is within normal limits. No talar tilt or subluxation is seen.  The joint spaces are preserved. No significant soft tissue abnormalities are seen.  IMPRESSION: 1. Suspect small avulsion fracture involving the proximal aspect of the cuboid. 2. Os peroneum noted.   Electronically Signed   By: Roanna Raider M.D.   On: 01/18/2015 02:41   Dg Knee Complete 4 Views Left  01/18/2015   CLINICAL DATA:  Status post fall at friends home, with pain and bruising about the left patella. Initial encounter.  EXAM: LEFT KNEE - COMPLETE 4+ VIEW  COMPARISON:  None.  FINDINGS: There is no evidence of fracture or dislocation. The joint spaces are preserved. No significant degenerative change is seen; the patellofemoral joint is grossly unremarkable in appearance.  No significant joint effusion is seen. The visualized soft tissues are normal in appearance.  IMPRESSION: No evidence of fracture or dislocation.   Electronically Signed   By: Roanna Raider M.D.   On: 01/18/2015 02:42   Dg Foot Complete Left  01/18/2015   CLINICAL DATA:  Twisted left ankle, with lateral malleolar and lateral foot pain. Initial encounter.  EXAM: LEFT FOOT - COMPLETE 3+ VIEW  COMPARISON:  None.  FINDINGS: There is suspicion of a small avulsion fracture at the proximal aspect of the cuboid. An os peroneum is noted. There is no additional evidence for fracture.  The subtalar joint is grossly unremarkable. Visualized joint spaces are grossly unremarkable in appearance. No definite soft tissue abnormalities are characterized on radiograph.  IMPRESSION: 1. Suspect small avulsion fracture at the proximal aspect of the cuboid. 2. Os peroneum noted.    Electronically Signed   By: Roanna Raider M.D.   On: 01/18/2015 02:42   SPLINT APPLICATION Date/Time: 3:01 AM Authorized by: Emilia Beck Consent: Verbal consent obtained. Risks and benefits: risks, benefits and alternatives were discussed Consent given by: patient Splint applied by: orthopedic technician Location details: left foot Splint type: posterior with stirrup Supplies used: orthoglass Post-procedure: The splinted body part was neurovascularly unchanged following the procedure. Patient tolerance: Patient tolerated the procedure well with no immediate complications.      EKG Interpretation None      MDM   Final diagnoses:  Cuboid fracture, left, closed, initial encounter  Fall, initial encounter  Left knee injury, initial encounter    3:01 AM Patient's xrays shows small avulsion fracture of proximal aspect of cuboid. Patient will have splint and crutches. No neurovascular compromise. No other injury.    [address] [place], PA-C 01/18/15 1610  Azalia Bilis, MD 01/18/15 [phone]

## 2015-01-22 NOTE — Progress Notes (Addendum)
01/21/2015 A. Ewa Hipp RNCM 0930am EDCM called [facility] in attempts to make an appointment for patient.  EDCM left voice mail and phone number for call back. [phone] EDCM called orthopedic Dr. Greig RightMurphy's office and was informed that because patient has Medicaid in surance patient needs to have her pcp refer her because she needs a 5 digit code.  EDCM called patient at phone  number [phone] but was unable to leave message because patient's voice message said, "The person you are calling is not taking phone calls right now."    01/21/2015 A. Antione Obar RNCM 1516pm EDCM [facility] stating that [facility] [place] [facility] and this is who she needs to have refer her to the orthopedic specialist.  [facility] call from patient reporting her phone has been shut off and provided new phone number [phone].  01/22/2015 A. Deija Buhrman RNCM 1530pm EDCM attempted to call patient on new number provided [phone] without success.  Patient's voice message now states, "The person you are calling is not available right now, please call again later."   01/23/2015 A. Ferre[facility]RNCM [facility] call from unit secretary informing [facility] this [facility] has called and provided new phone number [phone].  EDCM called patient back with success.  EDCM explianed to patient that her [facility] [place] [facility] and this is where she would get her referral to orthopedic specialist.  Patient reports she does not have a her Medicaid card.  EDCM instructed patient to call the DSS so that she may get a new Medicaid card.  EDCM provided patient her Medicaid number, patient able to tell [facility] correct spelling of last name, address, and birthday.  Patient concerned her pain medication is running out. Patient reports she has been using motrin but, "It's not doing anything."  EDCM instructed patient to [facility] first thing in the am to make an appointment to be seen ASAP.  Also informed patient of use of urgent care centers.  Patient thankful for assistance.  No further EDCM needs at this time.

## 2015-02-04 ENCOUNTER — Other Ambulatory Visit: Payer: Self-pay | Admitting: Physician Assistant

## 2015-02-04 DIAGNOSIS — N63 Unspecified lump in unspecified breast: Secondary | ICD-10-CM

## 2015-02-07 ENCOUNTER — Ambulatory Visit
Admission: RE | Admit: 2015-02-07 | Discharge: 2015-02-07 | Disposition: A | Discharge status: Home / Self Care | Payer: Medicaid Other | Source: Ambulatory Visit | Attending: Physician Assistant | Admitting: Physician Assistant

## 2015-02-07 ENCOUNTER — Other Ambulatory Visit: Payer: Self-pay | Admitting: Physician Assistant

## 2015-02-07 DIAGNOSIS — N63 Unspecified lump in unspecified breast: Secondary | ICD-10-CM

## 2015-06-25 ENCOUNTER — Emergency Department (HOSPITAL_COMMUNITY): Payer: Medicaid Other

## 2015-06-25 ENCOUNTER — Encounter (HOSPITAL_COMMUNITY): Payer: Self-pay

## 2015-06-25 DIAGNOSIS — G43909 Migraine, unspecified, not intractable, without status migrainosus: Secondary | ICD-10-CM | POA: Diagnosis present

## 2015-06-25 DIAGNOSIS — Z79899 Other long term (current) drug therapy: Secondary | ICD-10-CM

## 2015-06-25 DIAGNOSIS — Z888 Allergy status to other drugs, medicaments and biological substances status: Secondary | ICD-10-CM

## 2015-06-25 DIAGNOSIS — I2699 Other pulmonary embolism without acute cor pulmonale: Principal | ICD-10-CM | POA: Diagnosis present

## 2015-06-25 DIAGNOSIS — Z882 Allergy status to sulfonamides status: Secondary | ICD-10-CM

## 2015-06-25 DIAGNOSIS — F17210 Nicotine dependence, cigarettes, uncomplicated: Secondary | ICD-10-CM | POA: Diagnosis present

## 2015-06-25 DIAGNOSIS — F1290 Cannabis use, unspecified, uncomplicated: Secondary | ICD-10-CM | POA: Diagnosis present

## 2015-06-25 DIAGNOSIS — R569 Unspecified convulsions: Secondary | ICD-10-CM | POA: Diagnosis present

## 2015-06-25 DIAGNOSIS — R197 Diarrhea, unspecified: Secondary | ICD-10-CM | POA: Diagnosis present

## 2015-06-25 LAB — CBC
HEMATOCRIT: 33.6 % — AB (ref 36.0–46.0)
Hemoglobin: 11.4 g/dL — ABNORMAL LOW (ref 12.0–15.0)
MCH: 27 pg (ref 26.0–34.0)
MCHC: 33.9 g/dL (ref 30.0–36.0)
MCV: 79.6 fL (ref 78.0–100.0)
Platelets: 252 10*3/uL (ref 150–400)
RBC: 4.22 MIL/uL (ref 3.87–5.11)
RDW: 16.9 % — ABNORMAL HIGH (ref 11.5–15.5)
WBC: 8.7 10*3/uL (ref 4.0–10.5)

## 2015-06-25 LAB — BASIC METABOLIC PANEL
ANION GAP: 8 (ref 5–15)
BUN: 9 mg/dL (ref 6–20)
CHLORIDE: 106 mmol/L (ref 101–111)
CO2: 23 mmol/L (ref 22–32)
Calcium: 9.1 mg/dL (ref 8.9–10.3)
Creatinine, Ser: 1.12 mg/dL — ABNORMAL HIGH (ref 0.44–1.00)
GFR calc non Af Amer: 59 mL/min — ABNORMAL LOW (ref >60)
GLUCOSE: 100 mg/dL — AB (ref 65–99)
POTASSIUM: 4.1 mmol/L (ref 3.5–5.1)
Sodium: 137 mmol/L (ref 135–145)

## 2015-06-25 LAB — I-STAT TROPONIN, ED: TROPONIN I, POC: 0 ng/mL (ref 0.00–0.08)

## 2015-06-25 MED ORDER — OXYCODONE-ACETAMINOPHEN 5-325 MG PO TABS
ORAL_TABLET | ORAL | Status: AC
  Filled 2015-06-25: qty 1

## 2015-06-25 MED ORDER — OXYCODONE-ACETAMINOPHEN 5-325 MG PO TABS
1.0000 | ORAL_TABLET | Freq: Once | ORAL | Status: AC
Start: 2015-06-25 — End: 2015-06-25
  Administered 2015-06-25: 1 via ORAL

## 2015-06-25 NOTE — ED Notes (Signed)
To triage via EMS.  Onset 2-3 days ago pain started at left posterior lower rib area - stabbing, then started having pain underneath left armpit - sharp-  and left side of chest - squeezing.  Pain worse when taking breaths in and out.  Pt tearful at triage. EMS gave ASA 324 mg, NTG x 1 tab with no relief.

## 2015-06-25 NOTE — ED Notes (Signed)
Pt reports has not taken anything for pain.

## 2015-06-25 NOTE — ED Notes (Signed)
Secretary called nurse to lobby to re-evaluate pt, she is c/o pain is returning to severity of when she called EMS.  Pain did decrease after Oxycodone @ 2019.  Called Dr. Clarene Duke for orders, no orders obtained at this time.  Informed patient.

## 2015-06-26 ENCOUNTER — Inpatient Hospital Stay (HOSPITAL_COMMUNITY): Payer: Medicaid Other

## 2015-06-26 ENCOUNTER — Inpatient Hospital Stay (HOSPITAL_COMMUNITY)
Admission: EM | Admit: 2015-06-26 | Discharge: 2015-06-27 | DRG: 176 | Disposition: A | Discharge status: Home / Self Care | Payer: Medicaid Other | Attending: Internal Medicine | Admitting: Internal Medicine

## 2015-06-26 ENCOUNTER — Emergency Department (HOSPITAL_COMMUNITY): Payer: Medicaid Other

## 2015-06-26 ENCOUNTER — Encounter (HOSPITAL_COMMUNITY): Payer: Self-pay

## 2015-06-26 DIAGNOSIS — F17210 Nicotine dependence, cigarettes, uncomplicated: Secondary | ICD-10-CM | POA: Diagnosis present

## 2015-06-26 DIAGNOSIS — F1910 Other psychoactive substance abuse, uncomplicated: Secondary | ICD-10-CM

## 2015-06-26 DIAGNOSIS — F1290 Cannabis use, unspecified, uncomplicated: Secondary | ICD-10-CM | POA: Diagnosis present

## 2015-06-26 DIAGNOSIS — Z720 Tobacco use: Secondary | ICD-10-CM | POA: Diagnosis not present

## 2015-06-26 DIAGNOSIS — I2699 Other pulmonary embolism without acute cor pulmonale: Principal | ICD-10-CM

## 2015-06-26 DIAGNOSIS — Z888 Allergy status to other drugs, medicaments and biological substances status: Secondary | ICD-10-CM | POA: Diagnosis not present

## 2015-06-26 DIAGNOSIS — R197 Diarrhea, unspecified: Secondary | ICD-10-CM | POA: Diagnosis present

## 2015-06-26 DIAGNOSIS — R569 Unspecified convulsions: Secondary | ICD-10-CM | POA: Diagnosis present

## 2015-06-26 DIAGNOSIS — G43909 Migraine, unspecified, not intractable, without status migrainosus: Secondary | ICD-10-CM | POA: Diagnosis present

## 2015-06-26 DIAGNOSIS — Z882 Allergy status to sulfonamides status: Secondary | ICD-10-CM | POA: Diagnosis not present

## 2015-06-26 DIAGNOSIS — Z79899 Other long term (current) drug therapy: Secondary | ICD-10-CM | POA: Diagnosis not present

## 2015-06-26 HISTORY — DX: Tobacco use: Z72.0

## 2015-06-26 HISTORY — DX: Other psychoactive substance abuse, uncomplicated: F19.10

## 2015-06-26 LAB — HIV ANTIBODY (ROUTINE TESTING W REFLEX): HIV Screen 4th Generation wRfx: NONREACTIVE

## 2015-06-26 LAB — D-DIMER, QUANTITATIVE: D-Dimer, Quant: 1.03 ug/mL-FEU — ABNORMAL HIGH (ref 0.00–0.48)

## 2015-06-26 LAB — PROTIME-INR
INR: 1.05 (ref 0.00–1.49)
PROTHROMBIN TIME: 13.9 s (ref 11.6–15.2)

## 2015-06-26 LAB — APTT: aPTT: 70 seconds — ABNORMAL HIGH (ref 24–37)

## 2015-06-26 LAB — RAPID URINE DRUG SCREEN, HOSP PERFORMED
AMPHETAMINES: NOT DETECTED
BARBITURATES: NOT DETECTED
Benzodiazepines: NOT DETECTED
Cocaine: NOT DETECTED
OPIATES: NOT DETECTED
TETRAHYDROCANNABINOL: POSITIVE — AB

## 2015-06-26 LAB — ANTITHROMBIN III: ANTITHROMB III FUNC: 93 % (ref 75–120)

## 2015-06-26 LAB — GLUCOSE, CAPILLARY: GLUCOSE-CAPILLARY: 94 mg/dL (ref 65–99)

## 2015-06-26 LAB — HCG, QUANTITATIVE, PREGNANCY: hCG, Beta Chain, Quant, S: <1 m[IU]/mL (ref <5)

## 2015-06-26 MED ORDER — ONDANSETRON HCL 4 MG PO TABS
4.0000 mg | ORAL_TABLET | Freq: Four times a day (QID) | ORAL | Status: DC | PRN
  Administered 2015-06-26: 4 mg via ORAL
  Filled 2015-06-26: qty 1

## 2015-06-26 MED ORDER — HEPARIN (PORCINE) IN NACL 100-0.45 UNIT/ML-% IJ SOLN
1200.0000 [IU]/h | INTRAMUSCULAR | Status: DC
  Administered 2015-06-26: 1200 [IU]/h via INTRAVENOUS
  Filled 2015-06-26: qty 250

## 2015-06-26 MED ORDER — ONDANSETRON HCL 4 MG/2ML IJ SOLN
4.0000 mg | Freq: Four times a day (QID) | INTRAMUSCULAR | Status: DC | PRN
  Administered 2015-06-26: 4 mg via INTRAVENOUS
  Filled 2015-06-26: qty 2

## 2015-06-26 MED ORDER — OXYCODONE-ACETAMINOPHEN 5-325 MG PO TABS
1.0000 | ORAL_TABLET | Freq: Once | ORAL | Status: AC
  Administered 2015-06-26: 1 via ORAL
  Filled 2015-06-26: qty 1

## 2015-06-26 MED ORDER — OXYCODONE-ACETAMINOPHEN 5-325 MG PO TABS
2.0000 | ORAL_TABLET | ORAL | Status: DC | PRN
  Administered 2015-06-26 – 2015-06-27 (×4): 2 via ORAL
  Filled 2015-06-26 (×5): qty 2

## 2015-06-26 MED ORDER — NICOTINE 21 MG/24HR TD PT24
21.0000 mg | MEDICATED_PATCH | Freq: Every day | TRANSDERMAL | Status: DC
  Administered 2015-06-26 – 2015-06-27 (×2): 21 mg via TRANSDERMAL
  Filled 2015-06-26 (×2): qty 1

## 2015-06-26 MED ORDER — HEPARIN BOLUS VIA INFUSION
4000.0000 [IU] | Freq: Once | INTRAVENOUS | Status: AC
  Administered 2015-06-26: 4000 [IU] via INTRAVENOUS
  Filled 2015-06-26: qty 4000

## 2015-06-26 MED ORDER — RIVAROXABAN 20 MG PO TABS: 20.0000 mg | ORAL_TABLET | Freq: Every day | ORAL | Status: DC

## 2015-06-26 MED ORDER — RIVAROXABAN (XARELTO) EDUCATION KIT FOR DVT/PE PATIENTS
PACK | Freq: Once | Status: AC
  Administered 2015-06-26: 15:00:00
  Filled 2015-06-26: qty 1

## 2015-06-26 MED ORDER — IOHEXOL 350 MG/ML SOLN
60.0000 mL | Freq: Once | INTRAVENOUS | Status: AC | PRN
  Administered 2015-06-26: 60 mL via INTRAVENOUS

## 2015-06-26 MED ORDER — SODIUM CHLORIDE 0.9 % IV SOLN
INTRAVENOUS | Status: DC
  Administered 2015-06-26: 06:00:00 via INTRAVENOUS

## 2015-06-26 MED ORDER — AZITHROMYCIN 250 MG PO TABS
500.0000 mg | ORAL_TABLET | Freq: Once | ORAL | Status: AC
  Administered 2015-06-26: 500 mg via ORAL
  Filled 2015-06-26: qty 2

## 2015-06-26 MED ORDER — ACETAMINOPHEN 325 MG PO TABS
650.0000 mg | ORAL_TABLET | Freq: Four times a day (QID) | ORAL | Status: DC | PRN
  Administered 2015-06-26: 650 mg via ORAL
  Filled 2015-06-26: qty 2

## 2015-06-26 MED ORDER — ACETAMINOPHEN 650 MG RE SUPP
650.0000 mg | Freq: Four times a day (QID) | RECTAL | Status: DC | PRN
  Filled 2015-06-26: qty 1

## 2015-06-26 MED ORDER — HEPARIN (PORCINE) IN NACL 100-0.45 UNIT/ML-% IJ SOLN: 1200.0000 [IU]/h | INTRAMUSCULAR | Status: DC

## 2015-06-26 MED ORDER — DEXTROSE 5 % IV SOLN
1.0000 g | Freq: Once | INTRAVENOUS | Status: AC
  Administered 2015-06-26: 1 g via INTRAVENOUS
  Filled 2015-06-26: qty 10

## 2015-06-26 MED ORDER — RIVAROXABAN 15 MG PO TABS
15.0000 mg | ORAL_TABLET | Freq: Two times a day (BID) | ORAL | Status: DC
  Administered 2015-06-26 – 2015-06-27 (×3): 15 mg via ORAL
  Filled 2015-06-26 (×2): qty 1

## 2015-06-26 MED ORDER — SODIUM CHLORIDE 0.9 % IJ SOLN
3.0000 mL | Freq: Two times a day (BID) | INTRAMUSCULAR | Status: DC
  Administered 2015-06-26: 3 mL via INTRAVENOUS

## 2015-06-26 MED ORDER — MORPHINE SULFATE (PF) 2 MG/ML IV SOLN
2.0000 mg | INTRAVENOUS | Status: DC | PRN
  Administered 2015-06-26: 2 mg via INTRAVENOUS
  Filled 2015-06-26: qty 1

## 2015-06-26 MED ORDER — KETOROLAC TROMETHAMINE 30 MG/ML IJ SOLN
30.0000 mg | Freq: Once | INTRAMUSCULAR | Status: AC
  Administered 2015-06-26: 30 mg via INTRAVENOUS
  Filled 2015-06-26: qty 1

## 2015-06-26 NOTE — Progress Notes (Signed)
VASCULAR LAB PRELIMINARY  PRELIMINARY  PRELIMINARY  PRELIMINARY  Bilateral lower extremity venous duplex  completed.    Preliminary report:  Bilateral:  No evidence of DVT, superficial thrombosis, or Baker's Cyst.    Adarius Tigges, RVT 06/26/2015, 11:20 AM

## 2015-06-26 NOTE — Progress Notes (Signed)
ANTICOAGULATION CONSULT NOTE  Pharmacy Consult for Heparin / xarelto Indication: pulmonary embolus  Allergies  Allergen Reactions  . Sulfur Anaphylaxis  . Aspartame And Phenylalanine Swelling    Swelling of throat, fever    Patient Measurements: Height:  (167.6 cm) Weight: 158 lb 1.6 oz (71.714 kg) IBW/kg (Calculated) : 59.3 Heparin Dosing Weight: 71 kg  Vital Signs: Temp: 98.3 F (36.8 C) (08/24 0524) Temp Source: Oral (08/24 0524) BP: 108/63 mmHg (08/24 0524) Pulse Rate: 63 (08/24 0524)  Labs:  Recent Labs  06/25/15 2025 06/26/15 0634  HGB 11.4*  --   HCT 33.6*  --   PLT 252  --   APTT  --  70*  LABPROT  --  13.9  INR  --  1.05  CREATININE 1.12*  --     Estimated Creatinine Clearance: 65.7 mL/min (by C-G formula based on Cr of 1.12).   Medical History: Past Medical History  Diagnosis Date  . Migraines   . Seizure   . Ectopic pregnancy   . UTI (lower urinary tract infection)   . Kidney stones   . Tobacco abuse   . Drug abuse     Medications:  Ibuprofen prn and birth control  Assessment: 44 y.o. female presents with CP. Chest CT shows PE. Heparin started overnight. Now to to transition to xarelto. Hgb slightly low at baseline, plt ok. No bleed reported. Communicated with RN to d/c heparin drip at time of 1st dose of xarelto ~noon today   Goal of Therapy:  Heparin level 0.3-0.7 units/ml Monitor platelets by anticoagulation protocol: Yes   Plan:  Heparin gtt at 1200 units/hr >> transition to xarelto at 1200 Xarelto  bid x 21d, then xarelto  daily Mon CBC on xarelto q72h Mon s/sx bleeding  Babs Bertin, PharmD Clinical Pharmacist Pager [phone] 06/26/2015 10:18 AM

## 2015-06-26 NOTE — Progress Notes (Addendum)
Pt seen and examined, admitted this am per Dr.Niu 44/F with Acute PE, started on OCPs in march and current smoker Transition from Heparin to Xarelto, discussed warfarin and NOACs Hemodynamics stable, doubt pneumonia, suspect pulm infarct, stop Abx tomorrow if remains afebrile and otherwise stable Home in 1-2days  Zannie Cove, MD [phone]

## 2015-06-26 NOTE — Progress Notes (Signed)
UR Completed. Dastan Krider, RN, BSN.  [phone] 

## 2015-06-26 NOTE — Care Management Note (Signed)
Case Management Note  Patient Details  Name: Jasmine Christian MRN: [id number] Date of Birth: 21-Aug-1971  Subjective/Objective:     Pt admitted with PE               Action/Plan:  Pt is independent from home alone.  Pt will discharge home on Xarelto, CM will assist with initiation post discharge.   Expected Discharge Date:                  Expected Discharge Plan:  Home/Self Care  In-House Referral:     Discharge planning Services  CM Consult, Medication Assistance  Post Acute Care Choice:    Choice offered to:     DME Arranged:    DME Agency:     HH Arranged:    HH Agency:     Status of Service:  In process, will continue to follow  Medicare Important Message Given:    Date Medicare IM Given:    Medicare IM give by:    Date Additional Medicare IM Given:    Additional Medicare Important Message give by:     If discussed at Long Length of Stay Meetings, dates discussed:    Additional Comments: CM provided pt free 30 day card.  Pt stated she preferred to use at [facility] [place].  CM contacted pharmacy and was informed medication is now available for pick up.  CM informed pt of copay $3. Cherylann Parr, RN 06/26/2015, 11:59 AM

## 2015-06-26 NOTE — Progress Notes (Signed)
ANTICOAGULATION CONSULT NOTE - Initial Consult  Pharmacy Consult for Heparin Indication: pulmonary embolus  Allergies  Allergen Reactions  . Sulfur Anaphylaxis  . Aspartame And Phenylalanine Swelling    Swelling of throat, fever    Patient Measurements: Height:  (167.6 cm) Weight: 158 lb 1.6 oz (71.714 kg) IBW/kg (Calculated) : 59.3 Heparin Dosing Weight: 71 kg  Vital Signs: Temp: 98.4 F (36.9 C) (08/23 2008) Temp Source: Oral (08/23 2008) BP: 114/71 mmHg (08/24 0400) Pulse Rate: 68 (08/24 0400)  Labs:  Recent Labs  06/25/15 2025  HGB 11.4*  HCT 33.6*  PLT 252  CREATININE 1.12*    Estimated Creatinine Clearance: 65.7 mL/min (by C-G formula based on Cr of 1.12).   Medical History: Past Medical History  Diagnosis Date  . Migraines   . Seizure   . Ectopic pregnancy   . UTI (lower urinary tract infection)   . Kidney stones   . Tobacco abuse   . Drug abuse     Medications:  Ibuprofen prn and birth control  Assessment: 44 y.o. female presents with CP. Chest CT shows PE. To begin heparin. Hgb slightly low at baseline, plt ok.   Goal of Therapy:  Heparin level 0.3-0.7 units/ml Monitor platelets by anticoagulation protocol: Yes   Plan:  Heparin IV bolus 4000 units Heparin gtt at 1200 units/hr Will f/u heparin level in 6 hours Daily heparin level and CBC   Christoper Fabian, PharmD, BCPS Clinical pharmacist, pager [phone] 06/26/2015,4:28 AM

## 2015-06-26 NOTE — Discharge Instructions (Signed)
Information on my medicine - XARELTO (rivaroxaban)  This medication education was reviewed with me or my healthcare representative as part of my discharge preparation.  The pharmacist that spoke with me during my hospital stay was:  Almon Hercules, [facility]  WHY WAS Jasmine Christian PRESCRIBED FOR YOU? Xarelto was prescribed to treat blood clots that may have been found in the veins of your legs (deep vein thrombosis) or in your lungs (pulmonary embolism) and to reduce the risk of them occurring again.  What do you need to know about Xarelto? The starting dose is one 15 mg tablet taken TWICE daily with food for the FIRST 21 DAYS then on (enter date)  07/17/15  the dose is changed to one 20 mg tablet taken ONCE A DAY with your evening meal.  DO NOT stop taking Xarelto without talking to the health care provider who prescribed the medication.  Refill your prescription for 20 mg tablets before you run out.  After discharge, you should have regular check-up appointments with your healthcare provider that is prescribing your Xarelto.  In the future your dose may need to be changed if your kidney function changes by a significant amount.  What do you do if you miss a dose? If you are taking Xarelto TWICE DAILY and you miss a dose, take it as soon as you remember. You may take two 15 mg tablets (total 30 mg) at the same time then resume your regularly scheduled 15 mg twice daily the next day.  If you are taking Xarelto ONCE DAILY and you miss a dose, take it as soon as you remember on the same day then continue your regularly scheduled once daily regimen the next day. Do not take two doses of Xarelto at the same time.   Important Safety Information Xarelto is a blood thinner medicine that can cause bleeding. You should call your healthcare provider right away if you experience any of the following: ? Bleeding from an injury or your nose that does not stop. ? Unusual colored urine (red or dark brown) or  unusual colored stools (red or black). ? Unusual bruising for unknown reasons. ? A serious fall or if you hit your head (even if there is no bleeding).  Some medicines may interact with Xarelto and might increase your risk of bleeding while on Xarelto. To help avoid this, consult your healthcare provider or pharmacist prior to using any new prescription or non-prescription medications, including herbals, vitamins, non-steroidal anti-inflammatory drugs (NSAIDs) and supplements.  This website has more information on Xarelto: VisitDestination.com.br.

## 2015-06-26 NOTE — Progress Notes (Signed)
  Echocardiogram 2D Echocardiogram has been performed.  Jasmine Christian 06/26/2015, 3:19 PM

## 2015-06-26 NOTE — ED Provider Notes (Signed)
CSN: [id number]     Arrival date & time 06/25/15  1958 History  This chart was scribed for Jasmine Baton, MD by Lyndel Safe, ED Scribe. This patient was seen in room A07C/A07C and the patient's care was started 12:53 AM.   Chief Complaint  Patient presents with  . Chest Pain   The history is provided by the patient and the EMS personnel. No language interpreter was used.    HPI Comments: Jasmine Christian is a 44 y.o. female brought in by ambulance, who presents to the Emergency Department complaining of gradually worsening, constant, moderate left-sided chest pressure and a constant, moderate sharp pain in left posterior rib region onset 3 days ago. She describes the pain in her chest to be a pressure and the pain in her left posterior rib area to be a stabbing, sharp pain that began intermittently but is now constant. Her pain is exacerbated with coughing, sneezing, breathing and laughing. EMS administered aspirin 324mg  and 1 tab nitroglycerin en route with no relief. Pt notes mild relief with hydrocodone given in triage. Pt is taking birthcontrol medication. Denies a productive cough, fevers, congestion, history of PE/DVT, or taking daily medication. Allergy to sulfur.  Past Medical History  Diagnosis Date  . Migraines   . Seizure   . Ectopic pregnancy   . UTI (lower urinary tract infection)   . Kidney stones   . Tobacco abuse   . Drug abuse    Past Surgical History  Procedure Laterality Date  . Ectopic pregnancy surgery    . Wisdom tooth extraction     Family History  Problem Relation Age of Onset  . Cancer Maternal Aunt   . Cancer Maternal Grandmother   . Cancer Maternal Grandfather   . Diabetes Other    Social History  Substance Use Topics  . Smoking status: Current Every Day Smoker -- 0.50 packs/day    Types: Cigarettes  . Smokeless tobacco: None  . Alcohol Use: Yes     Comment: socially   OB History    Gravida Para Term Preterm AB TAB SAB Ectopic Multiple  Living   5 4 4  1   1  4      Review of Systems  Constitutional: Negative for fever.  HENT: Negative for congestion.   Respiratory: Positive for cough. Negative for chest tightness and shortness of breath.   Cardiovascular: Positive for chest pain.  Gastrointestinal: Negative for nausea, vomiting and abdominal pain.  Genitourinary: Negative for dysuria.  Musculoskeletal: Negative for back pain.  Neurological: Negative for headaches.  All other systems reviewed and are negative.  Allergies  Sulfur and Aspartame and phenylalanine  Home Medications   Prior to Admission medications   Medication Sig Start Date End Date Taking? Authorizing Provider  ibuprofen (ADVIL,MOTRIN) 200 MG tablet Take 800 mg by mouth every 6 (six) hours as needed (pain).   Yes Historical Provider, MD  Levonorgestrel-Ethinyl Estradiol (AMETHIA) 0.15-0.03 &0.01 MG tablet Take 1 tablet by mouth daily.   Yes Historical Provider, MD   BP 133/80 mmHg  Pulse 74  Temp(Src) 98.4 F (36.9 C) (Oral)  Resp 22  Ht 5\' 6"  (1.676 m)  Wt 158 lb 1.6 oz (71.714 kg)  BMI 25.53 kg/m2  SpO2 99%  LMP 05/25/2015 Physical Exam  Constitutional: She is oriented to person, place, and time. She appears well-developed and well-nourished.  Uncomfortable appearing  HENT:  Head: Normocephalic and atraumatic.  Cardiovascular: Normal rate, regular rhythm and normal heart sounds.  No murmur heard. Pulmonary/Chest: Effort normal and breath sounds normal. No respiratory distress. She has no wheezes. She exhibits no tenderness.  Abdominal: Soft. Bowel sounds are normal. There is no tenderness. There is no rebound.  Musculoskeletal: She exhibits no edema.  Neurological: She is alert and oriented to person, place, and time.  Skin: Skin is warm and dry.  Psychiatric: She has a normal mood and affect.  Nursing note and vitals reviewed.   ED Course  Procedures   CRITICAL CARE Performed by: Jasmine Christian   Total critical care  time: 35 min  Critical care time was exclusive of separately billable procedures and treating other patients.  Critical care was necessary to treat or prevent imminent or life-threatening deterioration.  Critical care was time spent personally by me on the following activities: development of treatment plan with patient and/or surrogate as well as nursing, discussions with consultants, evaluation of patient's response to treatment, examination of patient, obtaining history from patient or surrogate, ordering and performing treatments and interventions, ordering and review of laboratory studies, ordering and review of radiographic studies, pulse oximetry and re-evaluation of patient's condition.  DIAGNOSTIC STUDIES: Oxygen Saturation is 99% on RA, normal by my interpretation.    COORDINATION OF CARE: 12:59 AM Discussed treatment plan which includes to order diagnostic labs with pt. Discussed Chest Xray results with pt. Pt acknowledges and agrees to plan.   Labs Review Labs Reviewed  BASIC METABOLIC PANEL - Abnormal; Notable for the following:    Glucose, Bld 100 (*)    Creatinine, Ser 1.12 (*)    GFR calc non Af Amer 59 (*)    All other components within normal limits  CBC - Abnormal; Notable for the following:    Hemoglobin 11.4 (*)    HCT 33.6 (*)    RDW 16.9 (*)    All other components within normal limits  D-DIMER, QUANTITATIVE (NOT AT [facility]) - Abnormal; Notable for the following:    D-Dimer, Quant 1.03 (*)    All other components within normal limits  HEPARIN LEVEL (UNFRACTIONATED)  I-STAT TROPOININ, ED    Imaging Review Dg Chest 2 View  06/25/2015   CLINICAL DATA:  Left posterior chest pain and shortness of breath for 3 days. Chest axilla and back pain. Smoking history.  EXAM: CHEST  2 VIEW  COMPARISON:  08/28/2011  FINDINGS: The cardiomediastinal contours are normal. Linear and slightly patchy opacity in the lingula and left lower lobe. Right lung is clear. No pulmonary edema,  pleural effusion or pneumothorax. No acute osseous abnormalities are seen.  IMPRESSION: Linear and slightly patchy opacity in the lingula and left lower lobe, may reflect atelectasis versus pneumonia.   Electronically Signed   By: Rubye Oaks M.D.   On: 06/25/2015 21:04   Ct Angio Chest Pe W/cm &/or Wo Cm  06/26/2015   CLINICAL DATA:  Constant moderate left-sided chest pain for 3 days. Shortness of breath. D-dimer equal 1.03  EXAM: CT ANGIOGRAPHY CHEST WITH CONTRAST  TECHNIQUE: Multidetector CT imaging of the chest was performed using the standard protocol during bolus administration of intravenous contrast. Multiplanar CT image reconstructions and MIPs were obtained to evaluate the vascular anatomy.  CONTRAST:  60mL OMNIPAQUE IOHEXOL 350 MG/ML SOLN  COMPARISON:  None.  FINDINGS: Technically adequate study with good opacification of the central and segmental pulmonary arteries. Focal filling defects are demonstrated in distal subsegmental left lower lobe pulmonary artery branches consistent with peripheral emboli. No large central emboli are demonstrated. Infiltration in  the left lung base with small effusion could represent pneumonia or infarct. Atelectasis or infiltration also demonstrated in the right lung base to a lesser degree. No findings to suggest right heart strain.  Normal heart size. Normal caliber thoracic aorta. No evidence of aortic dissection. Great vessel origins are patent. Lymph nodes in the mediastinum are not pathologically enlarged. Esophagus is decompressed.  No pneumothorax.  Airways are patent.  Included portions of the upper abdominal organs are grossly unremarkable. No destructive bone lesions.  Review of the MIP images confirms the above findings.  IMPRESSION: Subsegmental pulmonary emboli demonstrated in the left lung base. Infiltration and small effusion on the left may represent pneumonia or infarct.  These results were called by telephone at the time of interpretation on  06/26/2015 at 3:50 am to Dr. Ross Marcus , who verbally acknowledged these results.   Electronically Signed   By: Burman Nieves M.D.   On: 06/26/2015 03:53   I have personally reviewed and evaluated these images and lab results as part of my medical decision-making.   EKG Interpretation   Date/Time:  Tuesday June 25 2015 20:02:19 EDT Ventricular Rate:  80 PR Interval:  106 QRS Duration: 92 QT Interval:  380 QTC Calculation: 438 R Axis:   70 Text Interpretation:  Sinus rhythm with short PR Incomplete right bundle  branch block Borderline ECG No significant change since last tracing  Confirmed by Javar Eshbach  MD, Nikky Duba ([id number]) on 06/26/2015 12:45:05 AM      MDM   Final diagnoses:  Pulmonary embolism    Patient presents with chest pain. It is left-sided. Somewhat pleuritic in nature but also worse with coughing. Patient appears uncomfortable but is nontoxic. Vital signs are reassuring. Low risk for ACS and EKG is reassuring. Chest x-ray shows evidence of atelectasis versus pneumonia. No evidence of leukocytosis. Given the patient is on OCPs, screening d-dimer was sent and is positive. CT PE protocol obtained and shows subsegmental pulmonary emboli in the left lung base. Feel this is likely causing the patient's pain. She also has a super imposed small effusion which may represent pneumonia or infarct. Patient was given azithromycin and Rocephin. She was also placed on heparin IV. Will be admitted to the hospital for further management.  I personally performed the services described in this documentation, which was scribed in my presence. The recorded information has been reviewed and is accurate.    Jasmine Baton, MD 06/26/15 [phone]

## 2015-06-26 NOTE — H&P (Signed)
[facility] History and Physical  Jasmine Christian ZOX:[id number] DOB: Apr 27, 1971 DOA: 06/26/2015  Referring physician: ED physician PCP: No PCP Per Patient  Specialists:   Chief Complaint: chest pain and diarrhea  HPI: Jasmine Christian is a 44 y.o. female with PMH of migraine headache, seizure, tobacco abuse, drug abuse, who presents with chest pain and diarrhea.  Patient states that she started having gradual onset chest pain 3 days ago, which has been progressively getting worse. The chest pain is located in the frontal chest, radiating to the left back. It is pleuritic and aggravated by deep breath. It is associated with shortness of breath. She has cough, without sputum production. She does not have fever or chills. Patient reports that she has been having tenderness over left calf area in the past 3 weeks. No recent long distance traveling. She has mild diarrhea in the past 3 days. She has 2 to 3 loose stool each day, no nausea, vomiting, but has mild abdominal pain. No recent antibiotics use. He does not have symptoms of UTI, unilateral weakness or rashes. Of note, patient is taking OCP pills (Amethia). No family history of blood clot.  In ED, patient was found to have WBC 8.7, negative troponin, normal temperature, no tachycardia, elevated d-dimer 1.03. CXR showed linear and slightly patchy opacity in the lingula and left lower lobe, may reflect atelectasis versus pneumonia. CTA-chest showed subsegmental pulmonary emboli demonstrated in the left lung base. Infiltration and small effusion on the left may represent pneumonia or infarct. Patient is admitted to inpatient for further evaluation and treatment.  Where does patient live?   At home    Can patient participate in ADLs?  Yes   Review of Systems:   General: no fevers, chills, no changes in body weight, has poor appetite, has fatigue HEENT: no blurry vision, hearing changes or sore throat Pulm: has dyspnea, coughing, no  wheezing CV: has chest pain, no palpitations Abd: no nausea, vomiting, has abdominal pain, diarrhea, no constipation GU: no dysuria, burning on urination, increased urinary frequency, hematuria  Ext: no leg edema Neuro: no unilateral weakness, numbness, or tingling, no vision change or hearing loss Skin: no rash MSK: No muscle spasm, no deformity, no limitation of range of movement in spin Heme: No easy bruising.  Travel history: No recent long distant travel.  Allergy:  Allergies  Allergen Reactions  . Sulfur Anaphylaxis  . Aspartame And Phenylalanine Swelling    Swelling of throat, fever    Past Medical History  Diagnosis Date  . Migraines   . Seizure   . Ectopic pregnancy   . UTI (lower urinary tract infection)   . Kidney stones   . Tobacco abuse   . Drug abuse     Past Surgical History  Procedure Laterality Date  . Ectopic pregnancy surgery    . Wisdom tooth extraction      Social History:  reports that she has been smoking Cigarettes.  She has been smoking about 0.50 packs per day. She does not have any smokeless tobacco history on file. She reports that she drinks alcohol. She reports that she uses illicit drugs (Marijuana).  Family History:  Family History  Problem Relation Age of Onset  . Cancer Maternal Aunt   . Cancer Maternal Grandmother   . Cancer Maternal Grandfather   . Diabetes Other      Prior to Admission medications   Medication Sig Start Date End Date Taking? Authorizing Provider  ibuprofen (ADVIL,MOTRIN) 200 MG tablet  Take 800 mg by mouth every 6 (six) hours as needed (pain).   Yes Historical Provider, MD  Levonorgestrel-Ethinyl Estradiol (AMETHIA) 0.15-0.03 &0.01 MG tablet Take 1 tablet by mouth daily.   Yes Historical Provider, MD    Physical Exam: Filed Vitals:   06/26/15 0352 06/26/15 0400 06/26/15 0430 06/26/15 0500  BP:  114/71 112/75 108/66  Pulse: 67 68 64 65  Temp:      TempSrc:      Resp: 20 18 21 22   Height:      Weight:       SpO2: 98% 98% 97% 97%   General: Not in acute distress HEENT:       Eyes: PERRL, EOMI, no scleral icterus.       ENT: No discharge from the ears and nose, no pharynx injection, no tonsillar enlargement.        Neck: No JVD, no bruit, no mass felt. Heme: No neck lymph node enlargement. Cardiac: S1/S2, RRR, No murmurs, No gallops or rubs. Pulm:  No rales, wheezing, rhonchi or rubs. Abd: Soft, nondistended, nontender, no rebound pain, no organomegaly, BS present. Ext: No pitting leg edema bilaterally. 2+DP/PT pulse bilaterally. There is tenderness over left calf area, no swelling or cord structure. Musculoskeletal: No joint deformities, No joint redness or warmth, no limitation of ROM in spin. Skin: No rashes.  Neuro: Alert, oriented X3, cranial nerves II-XII grossly intact, muscle strength 5/5 in all extremities, sensation to light touch intact.  Psych: Patient is not psychotic, no suicidal or hemocidal ideation.  Labs on Admission:  Basic Metabolic Panel:  Recent Labs Lab 06/25/15 2025  NA 137  K 4.1  CL 106  CO2 23  GLUCOSE 100*  BUN 9  CREATININE 1.12*  CALCIUM 9.1   Liver Function Tests: No results for input(s): AST, ALT, ALKPHOS, BILITOT, PROT, ALBUMIN in the last 168 hours. No results for input(s): LIPASE, AMYLASE in the last 168 hours. No results for input(s): AMMONIA in the last 168 hours. CBC:  Recent Labs Lab 06/25/15 2025  WBC 8.7  HGB 11.4*  HCT 33.6*  MCV 79.6  PLT 252   Cardiac Enzymes: No results for input(s): CKTOTAL, CKMB, CKMBINDEX, TROPONINI in the last 168 hours.  BNP (last 3 results) No results for input(s): BNP in the last 8760 hours.  ProBNP (last 3 results) No results for input(s): PROBNP in the last 8760 hours.  CBG: No results for input(s): GLUCAP in the last 168 hours.  Radiological Exams on Admission: Dg Chest 2 View  06/25/2015   CLINICAL DATA:  Left posterior chest pain and shortness of breath for 3 days. Chest axilla and back  pain. Smoking history.  EXAM: CHEST  2 VIEW  COMPARISON:  08/28/2011  FINDINGS: The cardiomediastinal contours are normal. Linear and slightly patchy opacity in the lingula and left lower lobe. Right lung is clear. No pulmonary edema, pleural effusion or pneumothorax. No acute osseous abnormalities are seen.  IMPRESSION: Linear and slightly patchy opacity in the lingula and left lower lobe, may reflect atelectasis versus pneumonia.   Electronically Signed   By: Rubye Oaks M.D.   On: 06/25/2015 21:04   Ct Angio Chest Pe W/cm &/or Wo Cm  06/26/2015   CLINICAL DATA:  Constant moderate left-sided chest pain for 3 days. Shortness of breath. D-dimer equal 1.03  EXAM: CT ANGIOGRAPHY CHEST WITH CONTRAST  TECHNIQUE: Multidetector CT imaging of the chest was performed using the standard protocol during bolus administration of intravenous contrast. Multiplanar CT  image reconstructions and MIPs were obtained to evaluate the vascular anatomy.  CONTRAST:  60mL OMNIPAQUE IOHEXOL 350 MG/ML SOLN  COMPARISON:  None.  FINDINGS: Technically adequate study with good opacification of the central and segmental pulmonary arteries. Focal filling defects are demonstrated in distal subsegmental left lower lobe pulmonary artery branches consistent with peripheral emboli. No large central emboli are demonstrated. Infiltration in the left lung base with small effusion could represent pneumonia or infarct. Atelectasis or infiltration also demonstrated in the right lung base to a lesser degree. No findings to suggest right heart strain.  Normal heart size. Normal caliber thoracic aorta. No evidence of aortic dissection. Great vessel origins are patent. Lymph nodes in the mediastinum are not pathologically enlarged. Esophagus is decompressed.  No pneumothorax.  Airways are patent.  Included portions of the upper abdominal organs are grossly unremarkable. No destructive bone lesions.  Review of the MIP images confirms the above findings.   IMPRESSION: Subsegmental pulmonary emboli demonstrated in the left lung base. Infiltration and small effusion on the left may represent pneumonia or infarct.  These results were called by telephone at the time of interpretation on 06/26/2015 at 3:50 am to Dr. Ross Marcus , who verbally acknowledged these results.   Electronically Signed   By: Burman Nieves M.D.   On: 06/26/2015 03:53    EKG: Independently reviewed.  Abnormal findings:  Incomplete right bundle blockage  Assessment/Plan Principal Problem:   PE (pulmonary embolism) Active Problems:   Tobacco abuse   Drug abuse   Pulmonary embolism   Diarrhea   PE (pulmonary embolism):  As evidenced by CTA,  No right heart strain. Patient is hemodynamically stable. ED started Rocephin and azithromycin due to concerning for possible pneumonia, but clinically patient does not seem to have pneumonia given no leukocytosis, fever or productive cough. Will hold antibiotics now. The risk factor is likely due to OCP.   -admit to tele -heparin drip initiated in ED -2D echocardiogram ordered -LE dopplers ordered to evaluate for DVT -Hypercoag panel -pain control: When necessary Percocet and morphine -hold OCP pill -check preg test  Tobacco abuse; -Did counseling about importance of quitting smoking -Nicotine patch -check UDS and HIV Ab  Diarrhea: Likely due to viral enteritis, but needs to rule out other possibility, such as C. difficile colitis. -IVF: NS 100 cc/h -c diff pcr   DVT ppx:on IV Heparin Code Status: Full code Family Communication: None at bed side.   Disposition Plan: Admit to inpatient   Date of Service 06/26/2015    Lorretta Harp [facility] Pager [phone]  If 7PM-7AM, please contact night-coverage [email or web address] Password [email or web address] 06/26/2015, 5:18 AM

## 2015-06-27 DIAGNOSIS — Z720 Tobacco use: Secondary | ICD-10-CM

## 2015-06-27 LAB — BASIC METABOLIC PANEL
Anion gap: 7 (ref 5–15)
BUN: 6 mg/dL (ref 6–20)
CALCIUM: 8.7 mg/dL — AB (ref 8.9–10.3)
CO2: 23 mmol/L (ref 22–32)
CREATININE: 0.9 mg/dL (ref 0.44–1.00)
Chloride: 109 mmol/L (ref 101–111)
GFR calc Af Amer: >60 mL/min (ref >60)
GLUCOSE: 87 mg/dL (ref 65–99)
Potassium: 3.9 mmol/L (ref 3.5–5.1)
Sodium: 139 mmol/L (ref 135–145)

## 2015-06-27 LAB — PROTEIN C, TOTAL: Protein C, Total: 74 % (ref 70–140)

## 2015-06-27 LAB — HOMOCYSTEINE: HOMOCYSTEINE-NORM: 10 umol/L (ref 0.0–15.0)

## 2015-06-27 LAB — CBC
HEMATOCRIT: 32.5 % — AB (ref 36.0–46.0)
Hemoglobin: 10.8 g/dL — ABNORMAL LOW (ref 12.0–15.0)
MCH: 27.2 pg (ref 26.0–34.0)
MCHC: 33.2 g/dL (ref 30.0–36.0)
MCV: 81.9 fL (ref 78.0–100.0)
Platelets: 250 10*3/uL (ref 150–400)
RBC: 3.97 MIL/uL (ref 3.87–5.11)
RDW: 17.1 % — AB (ref 11.5–15.5)
WBC: 6.9 10*3/uL (ref 4.0–10.5)

## 2015-06-27 LAB — GLUCOSE, CAPILLARY: GLUCOSE-CAPILLARY: 79 mg/dL (ref 65–99)

## 2015-06-27 MED ORDER — NICOTINE 14 MG/24HR TD PT24: 14.0000 mg | MEDICATED_PATCH | Freq: Every day | TRANSDERMAL | Status: DC

## 2015-06-27 MED ORDER — OXYCODONE-ACETAMINOPHEN 5-325 MG PO TABS: 1.0000 | ORAL_TABLET | Freq: Four times a day (QID) | ORAL | Status: DC | PRN

## 2015-06-27 MED ORDER — UNABLE TO FIND: Status: DC

## 2015-06-27 MED ORDER — RIVAROXABAN 20 MG PO TABS: 20.0000 mg | ORAL_TABLET | Freq: Every day | ORAL | Status: DC

## 2015-06-27 MED ORDER — RIVAROXABAN 15 MG PO TABS: 15.0000 mg | ORAL_TABLET | Freq: Two times a day (BID) | ORAL | Status: DC

## 2015-06-27 NOTE — Care Management Note (Addendum)
Case Management Note  Patient Details  Name: Jasmine D MONZERAT [id number] Date of Birth: 02/13/1971  Subjective/Objective:     Pt admitted with PE               Action/Plan:  Pt is independent from home alone.  Pt has PCP at [facility] [phone].   Pt will discharge home on Xarelto, CM will assist with initiation post discharge.   Expected Discharge Date:                  Expected Discharge Plan:  Home/Self Care  In-House Referral:     Discharge planning Services  CM Consult, Medication Assistance  Post Acute Care Choice:    Choice offered to:     DME Arranged:    DME Agency:     HH Arranged:    HH Agency:     Status of Service:  Complete, will sign off  Medicare Important Message Given:    Date Medicare IM Given:    Medicare IM give by:    Date Additional Medicare IM Given:    Additional Medicare Important Message give by:     If discussed at Long Length of Stay Meetings, dates discussed:    Additional Comments: 06/27/2015 Raynald Blend, RN, BSN [phone] Pt will discharge home today.  Per pt; she has a PCP at [facility] in [place], pt can not remember PCP name as she is a new pt and has only previously seen Cari Caraway NP at practice.  CM Verified With clinic that pt is a current patient scheduled to see NP Cari Caraway.  CM provided pt free 30 day card.  Pt stated she preferred to use at [facility] [place].  CM contacted pharmacy and was informed medication is now available for pick up.  CM informed pt of copay $3. Cherylann Parr, RN 06/27/2015, 9:09 AM

## 2015-06-27 NOTE — Discharge Summary (Signed)
Physician Discharge Summary  Jasmine Christian WGN:[id number] DOB: Mar 27, 1971 DOA: 06/26/2015  PCP: No PCP Per Patient  Admit date: 06/26/2015 Discharge date: 06/27/2015  Time spent: 45 minutes  Recommendations for Outpatient Follow-up:  1. PCP at [facility]  In 1 week 2. Needs Surgical FU for ? Cystic disease of breast  Discharge Diagnoses:  Principal Problem:   PE (pulmonary embolism) Active Problems:   Tobacco abuse   Drug abuse   Pulmonary embolism   Diarrhea   Discharge Condition: stable  Diet recommendation: regular  Filed Weights   06/25/15 2008  Weight: 71.714 kg (158 lb 1.6 oz)    History of present illness:  Chief Complaint: chest pain  HPI: Jasmine Christian is a 44 y.o. female with PMH of migraine headache, seizure, tobacco abuse, drug abuse, who presented with chest pain and diarrhea. Patient stated that she started having gradual onset chest pain 3 days ago, which has been progressively getting worse. The chest pain was located in the frontal chest, radiating to the left back. It was pleuritic and aggravated by deep breath, associated with shortness of breath.  Hospital Course:  1. Acute pulmonary embolism -Risk factors smoking and oral contraception pills -Was started on IV heparin on admission subsequent transition to Rivaroxaban,  -She remained hemodynamically stable, Dopplers were negative for DVT - advised to continue anticoagulation at least for 3-6 months - Advised to stop OCPs and smoking  -She was reportedly started on OCPs for question cystic lesions in her breast. -I have asked her to follow-up with the surgeon to have her cystic lesions in her breast evaluated. -She was seen by case management and given 30 day free card  2. Tobacco abuse -Counseled nicotine patch prescribed   Discharge Exam: Filed Vitals:   06/27/15 0514  BP: 100/62  Pulse: 64  Temp: 98.3 F (36.8 C)  Resp: 18    General: AAOx3 Cardiovascular:  S1S2/RRR Respiratory: CTAB  Discharge Instructions   Discharge Instructions    Diet general    Complete by:  As directed      Increase activity slowly    Complete by:  As directed           Current Discharge Medication List    START taking these medications   Details  nicotine (NICODERM CQ - DOSED IN MG/24 HOURS) 14 mg/24hr patch Place 1 patch (14 mg total) onto the skin daily. Qty: 28 patch, Refills: 0    oxyCODONE-acetaminophen (PERCOCET/ROXICET) 5-325 MG per tablet Take 1 tablet by mouth every 6 (six) hours as needed for moderate pain. Qty: 30 tablet, Refills: 0    !! Rivaroxaban (XARELTO) 15 MG TABS tablet Take 1 tablet (15 mg total) by mouth 2 (two) times daily with a meal. Qty: 42 tablet, Refills: 0    !! rivaroxaban (XARELTO) 20 MG TABS tablet Take 1 tablet (20 mg total) by mouth daily with supper. Qty: 30 tablet, Refills: 1     !! - Potential duplicate medications found. Please discuss with provider.    STOP taking these medications     ibuprofen (ADVIL,MOTRIN) 200 MG tablet      Levonorgestrel-Ethinyl Estradiol (AMETHIA) 0.15-0.03 &0.01 MG tablet        Allergies  Allergen Reactions  . Sulfur Anaphylaxis  . Aspartame And Phenylalanine Swelling    Swelling of throat, fever   Follow-up Information    Follow up with PCP. Schedule an appointment as soon as possible for a visit in 1 week.  Why:  and also need surgical evaluation for cystic disease in breast       The results of significant diagnostics from this hospitalization (including imaging, microbiology, ancillary and laboratory) are listed below for reference.    Significant Diagnostic Studies: Dg Chest 2 View  06/25/2015   CLINICAL DATA:  Left posterior chest pain and shortness of breath for 3 days. Chest axilla and back pain. Smoking history.  EXAM: CHEST  2 VIEW  COMPARISON:  08/28/2011  FINDINGS: The cardiomediastinal contours are normal. Linear and slightly patchy opacity in the lingula and  left lower lobe. Right lung is clear. No pulmonary edema, pleural effusion or pneumothorax. No acute osseous abnormalities are seen.  IMPRESSION: Linear and slightly patchy opacity in the lingula and left lower lobe, may reflect atelectasis versus pneumonia.   Electronically Signed   By: Rubye Oaks M.D.   On: 06/25/2015 21:04   Ct Angio Chest Pe W/cm &/or Wo Cm  06/26/2015   CLINICAL DATA:  Constant moderate left-sided chest pain for 3 days. Shortness of breath. D-dimer equal 1.03  EXAM: CT ANGIOGRAPHY CHEST WITH CONTRAST  TECHNIQUE: Multidetector CT imaging of the chest was performed using the standard protocol during bolus administration of intravenous contrast. Multiplanar CT image reconstructions and MIPs were obtained to evaluate the vascular anatomy.  CONTRAST:  60mL OMNIPAQUE IOHEXOL 350 MG/ML SOLN  COMPARISON:  None.  FINDINGS: Technically adequate study with good opacification of the central and segmental pulmonary arteries. Focal filling defects are demonstrated in distal subsegmental left lower lobe pulmonary artery branches consistent with peripheral emboli. No large central emboli are demonstrated. Infiltration in the left lung base with small effusion could represent pneumonia or infarct. Atelectasis or infiltration also demonstrated in the right lung base to a lesser degree. No findings to suggest right heart strain.  Normal heart size. Normal caliber thoracic aorta. No evidence of aortic dissection. Great vessel origins are patent. Lymph nodes in the mediastinum are not pathologically enlarged. Esophagus is decompressed.  No pneumothorax.  Airways are patent.  Included portions of the upper abdominal organs are grossly unremarkable. No destructive bone lesions.  Review of the MIP images confirms the above findings.  IMPRESSION: Subsegmental pulmonary emboli demonstrated in the left lung base. Infiltration and small effusion on the left may represent pneumonia or infarct.  These results were  called by telephone at the time of interpretation on 06/26/2015 at 3:50 am to Dr. Ross Marcus , who verbally acknowledged these results.   Electronically Signed   By: Burman Nieves M.D.   On: 06/26/2015 03:53    Microbiology: No results found for this or any previous visit (from the past 240 hour(s)).   Labs: Basic Metabolic Panel:  Recent Labs Lab 06/25/15 2025 06/27/15 0533  NA 137 139  K 4.1 3.9  CL 106 109  CO2 23 23  GLUCOSE 100* 87  BUN 9 6  CREATININE 1.12* 0.90  CALCIUM 9.1 8.7*   Liver Function Tests: No results for input(s): AST, ALT, ALKPHOS, BILITOT, PROT, ALBUMIN in the last 168 hours. No results for input(s): LIPASE, AMYLASE in the last 168 hours. No results for input(s): AMMONIA in the last 168 hours. CBC:  Recent Labs Lab 06/25/15 2025 06/27/15 0533  WBC 8.7 6.9  HGB 11.4* 10.8*  HCT 33.6* 32.5*  MCV 79.6 81.9  PLT 252 250   Cardiac Enzymes: No results for input(s): CKTOTAL, CKMB, CKMBINDEX, TROPONINI in the last 168 hours. BNP: BNP (last 3 results) No results for  input(s): BNP in the last 8760 hours.  ProBNP (last 3 results) No results for input(s): PROBNP in the last 8760 hours.  CBG:  Recent Labs Lab 06/26/15 0635 06/27/15 0613  GLUCAP 94 79       Signed:  Airlie Blumenberg  [facility] 06/27/2015, 11:11 AM

## 2015-06-28 LAB — BETA-2-GLYCOPROTEIN I ABS, IGG/M/A
Beta-2 Glyco I IgG: <9 GPI IgG units (ref 0–20)
Beta-2-Glycoprotein I IgA: <9 GPI IgA units (ref 0–25)

## 2015-06-28 LAB — LUPUS ANTICOAGULANT PANEL
DRVVT: 44.6 s (ref 0.0–55.1)
PTT LA: 34.8 s (ref 0.0–50.0)

## 2015-06-28 LAB — CARDIOLIPIN ANTIBODIES, IGG, IGM, IGA
Anticardiolipin IgG: <9 GPL U/mL (ref 0–14)
Anticardiolipin IgM: <9 MPL U/mL (ref 0–12)

## 2015-06-28 LAB — PROTEIN S ACTIVITY: Protein S Activity: 56 % — ABNORMAL LOW (ref 60–145)

## 2015-06-28 LAB — PROTEIN S, TOTAL: Protein S Ag, Total: 93 % (ref 58–150)

## 2015-06-28 LAB — PROTEIN C ACTIVITY: PROTEIN C ACTIVITY: 113 % (ref 74–151)

## 2015-07-01 LAB — PROTHROMBIN GENE MUTATION

## 2015-07-01 LAB — FACTOR 5 LEIDEN

## 2016-09-04 ENCOUNTER — Emergency Department (HOSPITAL_COMMUNITY)
Admission: EM | Admit: 2016-09-04 | Discharge: 2016-09-04 | Disposition: A | Discharge status: Home / Self Care | Payer: Medicaid Other | Attending: Emergency Medicine | Admitting: Emergency Medicine

## 2016-09-04 ENCOUNTER — Encounter (HOSPITAL_COMMUNITY): Payer: Self-pay | Admitting: *Deleted

## 2016-09-04 ENCOUNTER — Emergency Department (HOSPITAL_COMMUNITY): Payer: Medicaid Other

## 2016-09-04 DIAGNOSIS — Z7901 Long term (current) use of anticoagulants: Secondary | ICD-10-CM | POA: Insufficient documentation

## 2016-09-04 DIAGNOSIS — F17210 Nicotine dependence, cigarettes, uncomplicated: Secondary | ICD-10-CM | POA: Insufficient documentation

## 2016-09-04 DIAGNOSIS — R079 Chest pain, unspecified: Secondary | ICD-10-CM

## 2016-09-04 DIAGNOSIS — R0600 Dyspnea, unspecified: Secondary | ICD-10-CM

## 2016-09-04 DIAGNOSIS — R0789 Other chest pain: Secondary | ICD-10-CM | POA: Insufficient documentation

## 2016-09-04 DIAGNOSIS — K449 Diaphragmatic hernia without obstruction or gangrene: Secondary | ICD-10-CM | POA: Diagnosis not present

## 2016-09-04 LAB — I-STAT TROPONIN, ED
TROPONIN I, POC: 0 ng/mL (ref 0.00–0.08)
Troponin i, poc: 0 ng/mL (ref 0.00–0.08)

## 2016-09-04 LAB — HEPATIC FUNCTION PANEL
ALBUMIN: 4.2 g/dL (ref 3.5–5.0)
ALK PHOS: 60 U/L (ref 38–126)
ALT: 19 U/L (ref 14–54)
AST: 20 U/L (ref 15–41)
Bilirubin, Direct: <0.1 mg/dL — ABNORMAL LOW (ref 0.1–0.5)
TOTAL PROTEIN: 7.2 g/dL (ref 6.5–8.1)
Total Bilirubin: 0.6 mg/dL (ref 0.3–1.2)

## 2016-09-04 LAB — CBC
HEMATOCRIT: 32.3 % — AB (ref 36.0–46.0)
HEMOGLOBIN: 10.8 g/dL — AB (ref 12.0–15.0)
MCH: 26.3 pg (ref 26.0–34.0)
MCHC: 33.4 g/dL (ref 30.0–36.0)
MCV: 78.6 fL (ref 78.0–100.0)
Platelets: 302 10*3/uL (ref 150–400)
RBC: 4.11 MIL/uL (ref 3.87–5.11)
RDW: 16.2 % — AB (ref 11.5–15.5)
WBC: 8.7 10*3/uL (ref 4.0–10.5)

## 2016-09-04 LAB — BASIC METABOLIC PANEL
ANION GAP: 8 (ref 5–15)
BUN: 9 mg/dL (ref 6–20)
CHLORIDE: 108 mmol/L (ref 101–111)
CO2: 21 mmol/L — ABNORMAL LOW (ref 22–32)
Calcium: 9.1 mg/dL (ref 8.9–10.3)
Creatinine, Ser: 0.82 mg/dL (ref 0.44–1.00)
GFR calc Af Amer: >60 mL/min (ref >60)
GLUCOSE: 88 mg/dL (ref 65–99)
POTASSIUM: 3.8 mmol/L (ref 3.5–5.1)
Sodium: 137 mmol/L (ref 135–145)

## 2016-09-04 LAB — I-STAT BETA HCG BLOOD, ED (MC, WL, AP ONLY): I-stat hCG, quantitative: <5 m[IU]/mL (ref <5)

## 2016-09-04 LAB — LIPASE, BLOOD: LIPASE: 33 U/L (ref 11–51)

## 2016-09-04 MED ORDER — OMEPRAZOLE 20 MG PO CPDR: 20.0000 mg | DELAYED_RELEASE_CAPSULE | Freq: Every day | ORAL | 1 refills | Status: DC

## 2016-09-04 MED ORDER — IOPAMIDOL (ISOVUE-370) INJECTION 76%
100.0000 mL | Freq: Once | INTRAVENOUS | Status: AC | PRN
Start: 2016-09-04 — End: 2016-09-04
  Administered 2016-09-04: 61 mL via INTRAVENOUS

## 2016-09-04 MED ORDER — NAPROXEN 375 MG PO TABS: 375.0000 mg | ORAL_TABLET | Freq: Two times a day (BID) | ORAL | 0 refills | Status: DC

## 2016-09-04 NOTE — Progress Notes (Signed)
09/04/16 1357 Pt confirms she is changing pcp to Gwendol[facility] [place] [facility] [employer] [place] DSS is trying to assist her to change name recently Clinton GallantKimberly L Gibbs, RN, CCM [phone]

## 2016-09-04 NOTE — ED Triage Notes (Addendum)
Patient presents with chest pain that is intermittently sharp x4 days.  Patient also endorses a squeezing pain.  Pain radiates into left arm. Patient also endorses dizziness with standing, nausea and SOB, but denies vomiting.  No LE edema noted.  Patient is in no distress in triage, speaking in complete sentences without difficulty.  EKG done in triage.  Patient had a PE in August 2016 and has not taken her Xarelto since last November because she has had difficulty finding a consistent, but she [facility] today and was sent here. Patient still smokes occasionally, but is no longer on birth control.

## 2016-09-04 NOTE — ED Notes (Signed)
Pt ambulated to bathroom by herself.  Pt's gait steady.

## 2016-09-04 NOTE — ED Notes (Signed)
Pt ambulatory and independent at discharge.  Verbalized understanding of discharge instructions 

## 2016-09-04 NOTE — ED Provider Notes (Signed)
[facility]-EMERGENCY DEPT Provider Note   CSN: [id number] Arrival date & time: 09/04/16  1220     History   Chief Complaint Chief Complaint  Patient presents with  . Chest Pain    HPI Jasmine Christian is a 45 y.o. female.  HPI Patient has history of a pulmonary embolus approximately a year ago last summer. She reports that she took her medications until about last fall. She reports she was feeling better and she was supposed to get a follow-up but due to road blocks with her insurance, she ended up not getting follow-up and not continuing anticoagulants. She reports she's been feeling fine up until about 4 days ago. She then started to get increasing shortness of breath particularly with exertion. She reports she is also noting chest pain that has both a tight quality as well as a left sided sharper quality. She has not had specific pain in her calves but she feels that her ankles often swell. She reports she never knew exactly why she got the blood clot. She reports that at that time she had a broken foot and a been on birth control and smoked and it was attributed to that. She reports she has quit smoking and is no longer on birth control Past Medical History:  Diagnosis Date  . Drug abuse   . Ectopic pregnancy   . Kidney stones   . Migraines   . Seizure (HCC)   . Tobacco abuse   . UTI (lower urinary tract infection)     Patient Active Problem List   Diagnosis Date Noted  . PE (pulmonary embolism) 06/26/2015  . Pulmonary embolism (HCC) 06/26/2015  . Diarrhea 06/26/2015  . Tobacco abuse   . Drug abuse     Past Surgical History:  Procedure Laterality Date  . ECTOPIC PREGNANCY SURGERY    . WISDOM TOOTH EXTRACTION      OB History    Gravida Para Term Preterm AB Living   5 4 4   1 4    SAB TAB Ectopic Multiple Live Births       1           Home Medications    Prior to Admission medications   Medication Sig Start Date End Date Taking? Authorizing Provider  ibuprofen  (ADVIL,MOTRIN) 200 MG tablet Take 400 mg by mouth every 6 (six) hours as needed for fever, headache, mild pain, moderate pain or cramping.   Yes Historical Provider, MD  naproxen (NAPROSYN) 375 MG tablet Take 1 tablet (375 mg total) by mouth 2 (two) times daily. 09/04/16   Arby BarretteMarcy Avraham Benish, MD  omeprazole (PRILOSEC) 20 MG capsule Take 1 capsule (20 mg total) by mouth daily. 09/04/16   Arby BarretteMarcy Harlowe Dowler, MD  Rivaroxaban (XARELTO) 15 MG TABS tablet Take 1 tablet (15 mg total) by mouth 2 (two) times daily with a meal. 06/27/15 07/16/15  Zannie CovePreetha Joseph, MD  rivaroxaban (XARELTO) 20 MG TABS tablet Take 1 tablet (20 mg total) by mouth daily with supper. Patient not taking: Reported on 09/04/2016 07/17/15   Zannie CovePreetha Joseph, MD    Family History Family History  Problem Relation Age of Onset  . Cancer Maternal Aunt   . Cancer Maternal Grandmother   . Cancer Maternal Grandfather   . Diabetes Other     Social History Social History  Substance Use Topics  . Smoking status: Current Some Day Smoker    Packs/day: 0.50    Types: Cigarettes  . Smokeless tobacco: Never Used  . Alcohol  use Yes     Comment: socially     Allergies   Sulfur and Aspartame and phenylalanine   Review of Systems Review of Systems 10 Systems reviewed and are negative for acute change except as noted in the HPI.   Physical Exam Updated Vital Signs BP 111/72 (BP Location: Right Arm)   Pulse 60   Temp 98.3 F (36.8 C) (Oral)   Resp 21   Ht 5\' 6"  (1.676 m)   Wt 173 lb (78.5 kg)   LMP 08/21/2016   SpO2 100%   BMI 27.92 kg/m   Physical Exam  Constitutional: She appears well-developed and well-nourished. No distress.  HENT:  Head: Normocephalic and atraumatic.  Mouth/Throat: Oropharynx is clear and moist.  Eyes: Conjunctivae and EOM are normal.  Neck: Neck supple.  Cardiovascular: Normal rate and regular rhythm.   No murmur heard. Pulmonary/Chest: Effort normal and breath sounds normal. No respiratory distress.    Abdominal: Soft. She exhibits no distension. There is no tenderness.  Musculoskeletal: She exhibits no edema or tenderness.  Patient does not have perceptible edema. Calves are soft and nontender.  Neurological: She is alert.  Skin: Skin is warm and dry.  Psychiatric: She has a normal mood and affect.  Nursing note and vitals reviewed.    ED Treatments / Results  Labs (all labs ordered are listed, but only abnormal results are displayed) Labs Reviewed  BASIC METABOLIC PANEL - Abnormal; Notable for the following:       Result Value   CO2 21 (*)    All other components within normal limits  CBC - Abnormal; Notable for the following:    Hemoglobin 10.8 (*)    HCT 32.3 (*)    RDW 16.2 (*)    All other components within normal limits  HEPATIC FUNCTION PANEL - Abnormal; Notable for the following:    Bilirubin, Direct <0.1 (*)    All other components within normal limits  LIPASE, BLOOD  I-STAT TROPOININ, ED  I-STAT BETA HCG BLOOD, ED ([facility], [facility], [facility] ONLY)  I-STAT TROPOININ, ED    EKG  EKG Interpretation None       Radiology Dg Chest 2 View  Result Date: 09/04/2016 CLINICAL DATA:  Chest pain, left arm pain for 4 days EXAM: CHEST  2 VIEW COMPARISON:  None. FINDINGS: The heart size and mediastinal contours are within normal limits. Both lungs are clear. The visualized skeletal structures are unremarkable. IMPRESSION: No active cardiopulmonary disease. Electronically Signed   By: Elige Ko   On: 09/04/2016 13:10   Ct Angio Chest Pe W/cm &/or Wo Cm  Result Date: 09/04/2016 CLINICAL DATA:  Chest pain. History of pulmonary embolus. Shortness of breath. EXAM: CT ANGIOGRAPHY CHEST WITH CONTRAST TECHNIQUE: Multidetector CT imaging of the chest was performed using the standard protocol during bolus administration of intravenous contrast. Multiplanar CT image reconstructions and MIPs were obtained to evaluate the vascular anatomy. CONTRAST:  61 mL Isovue 370 nonionic COMPARISON:  Chest CT  angiogram June 26, 2015; chest radiograph September 04, 2016 FINDINGS: Cardiovascular: There is no demonstrable pulmonary embolus. There is no thoracic aortic aneurysm or dissection. The visualized great vessels appear normal. There is no appreciable pericardial thickening. Mediastinum/Nodes: There is a 1.0 x 0.9 cm nodular opacity in the inferior left lobe of the thyroid, not present on prior study. Thyroid otherwise appears unremarkable. There is no evident thoracic adenopathy. There is a small hiatal hernia. Lungs/Pleura: There is mild bibasilar atelectatic change. There is no lung edema  or consolidation. No pleural effusion or pleural thickening is demonstrable. Upper Abdomen: Visualized upper abdominal structures appear unremarkable. Musculoskeletal: There are no blastic or lytic bone lesions. No chest wall lesion evident. Review of the MIP images confirms the above findings. IMPRESSION: No demonstrable pulmonary embolus. No edema or consolidation. No adenopathy. There is a small nodular lesion in the left lobe of the thyroid. There is a small hiatal hernia. Electronically Signed   By: Bretta BangWilliam  Woodruff III M.D.   On: 09/04/2016 15:52    Procedures Procedures (including critical care time)  Medications Ordered in ED Medications  iopamidol (ISOVUE-370) 76 % injection 100 mL (61 mLs Intravenous Contrast Given 09/04/16 1535)     Initial Impression / Assessment and Plan / ED Course  I have reviewed the triage vital signs and the nursing notes.  Pertinent labs & imaging results that were available during my care of the patient were reviewed by me and considered in my medical decision making (see chart for details).  Clinical Course    Final Clinical Impressions(s) / ED Diagnoses   Final diagnoses:  Hiatal hernia  Chest pain, unspecified type  Dyspnea, unspecified type  CT has ruled out pulmonary embolus. Hiatal hernia is identified. 2 sets of cardiac enzymes of ruled out for MI. His general  condition is good. She is alert and well in appearance with normal examination. At this time, plan will be to initiate Prilosec or hiatal hernia and naproxen for chest pain which has musculoskeletal component to it. Patient has a family physician. She will follow-up for recheck. She is made aware of incidental thyroid nodule finding on CT scan.   New Prescriptions New Prescriptions   NAPROXEN (NAPROSYN) 375 MG TABLET    Take 1 tablet (375 mg total) by mouth 2 (two) times daily.   OMEPRAZOLE (PRILOSEC) 20 MG CAPSULE    Take 1 capsule (20 mg total) by mouth daily.     Arby BarretteMarcy Corleen Otwell, MD 09/04/16 1745

## 2016-09-04 NOTE — ED Notes (Signed)
RN starting IV 

## 2016-09-09 ENCOUNTER — Encounter (HOSPITAL_COMMUNITY): Payer: Self-pay | Admitting: Emergency Medicine

## 2016-09-09 ENCOUNTER — Emergency Department (HOSPITAL_COMMUNITY): Payer: Medicaid Other

## 2016-09-09 ENCOUNTER — Emergency Department (HOSPITAL_COMMUNITY)
Admission: EM | Admit: 2016-09-09 | Discharge: 2016-09-09 | Disposition: A | Discharge status: Home / Self Care | Payer: Medicaid Other | Attending: Physician Assistant | Admitting: Physician Assistant

## 2016-09-09 DIAGNOSIS — R079 Chest pain, unspecified: Secondary | ICD-10-CM

## 2016-09-09 DIAGNOSIS — Z79899 Other long term (current) drug therapy: Secondary | ICD-10-CM | POA: Insufficient documentation

## 2016-09-09 DIAGNOSIS — F17210 Nicotine dependence, cigarettes, uncomplicated: Secondary | ICD-10-CM | POA: Insufficient documentation

## 2016-09-09 DIAGNOSIS — R0789 Other chest pain: Secondary | ICD-10-CM | POA: Diagnosis present

## 2016-09-09 DIAGNOSIS — Z7901 Long term (current) use of anticoagulants: Secondary | ICD-10-CM | POA: Diagnosis not present

## 2016-09-09 LAB — BASIC METABOLIC PANEL
Anion gap: 7 (ref 5–15)
BUN: 11 mg/dL (ref 6–20)
CHLORIDE: 110 mmol/L (ref 101–111)
CO2: 22 mmol/L (ref 22–32)
Calcium: 9.3 mg/dL (ref 8.9–10.3)
Creatinine, Ser: 0.94 mg/dL (ref 0.44–1.00)
GFR calc Af Amer: >60 mL/min (ref >60)
GFR calc non Af Amer: >60 mL/min (ref >60)
GLUCOSE: 113 mg/dL — AB (ref 65–99)
POTASSIUM: 3.9 mmol/L (ref 3.5–5.1)
Sodium: 139 mmol/L (ref 135–145)

## 2016-09-09 LAB — CBC
HEMATOCRIT: 32.6 % — AB (ref 36.0–46.0)
Hemoglobin: 11 g/dL — ABNORMAL LOW (ref 12.0–15.0)
MCH: 26.4 pg (ref 26.0–34.0)
MCHC: 33.7 g/dL (ref 30.0–36.0)
MCV: 78.2 fL (ref 78.0–100.0)
Platelets: 286 10*3/uL (ref 150–400)
RBC: 4.17 MIL/uL (ref 3.87–5.11)
RDW: 16.7 % — AB (ref 11.5–15.5)
WBC: 8 10*3/uL (ref 4.0–10.5)

## 2016-09-09 LAB — I-STAT TROPONIN, ED
TROPONIN I, POC: 0 ng/mL (ref 0.00–0.08)
Troponin i, poc: 0 ng/mL (ref 0.00–0.08)

## 2016-09-09 MED ORDER — CYCLOBENZAPRINE HCL 10 MG PO TABS: 10.0000 mg | ORAL_TABLET | Freq: Two times a day (BID) | ORAL | 0 refills | Status: DC | PRN

## 2016-09-09 MED ORDER — OXYCODONE-ACETAMINOPHEN 5-325 MG PO TABS: 1.0000 | ORAL_TABLET | Freq: Four times a day (QID) | ORAL | 0 refills | Status: DC | PRN

## 2016-09-09 NOTE — ED Notes (Signed)
Pt in room. Pt currently changing into gown. Advised pt to call out when settled.

## 2016-09-09 NOTE — ED Triage Notes (Signed)
Pt sts CP and back pain; [facility]; pt sts started on prilosec but sts pain is worse; pt sts weight gain and fluid increase

## 2016-09-09 NOTE — ED Notes (Signed)
Pt's vitals updated. Pt ready for discharge.

## 2016-09-09 NOTE — Discharge Instructions (Signed)
We are unsure what is causing your pain. We're reassured by your negative CT earlier this week, and her normal lab work. Additionally her normal vital signs are reassuring. We want you to be very careful about your symptoms. We are giving you a referral for endocrinology to follow-up with your thyroid.  Be sure to watch for any skin changes or other concerns and return immediately.

## 2016-09-09 NOTE — ED Provider Notes (Signed)
[facility]-EMERGENCY DEPT Provider Note   CSN: [id number] Arrival date & time: 09/09/16  1048     History   Chief Complaint Chief Complaint  Patient presents with  . Chest Pain  . Back Pain    HPI Jasmine Christian is a 45 y.o. female.  HPI   Pt is a 45 yo female presenting with  Left-sided chest pain. Patient was seen for this already earlier this week. At that time she had a CT angio, delta troponins all of which were normal. Patient reports this pain is been going on for over a week. She reports that that her left-sided chest wall. She reports it is worse with movement of the left arm. She says it's located in the left lateral breast. Some pain with palpation. Worse with movement. Not worse on exertion. She's says it can happen at rest. Not associated with diaphoresis, shortness of breath.  Patient reports going try to follow up with her primary care physician however her primary care physician has no power so she is not able to see her until Monday. So the PCP told her to come here for the chest pain workup.  The chest pain appears to be the main complaint today. However patient has other complaints that are secondary. She says that she's had a recent 30 pound weight gain unintentionally. She is concerned about swelling in her body. She is also concerned about numbness in different places in her body. She reports sometimes it's in her leg sometimes it's in her right arm. Most recently it has been in her left arm. It comes and goes  Past Medical History:  Diagnosis Date  . Drug abuse   . Ectopic pregnancy   . Kidney stones   . Migraines   . Seizure (HCC)   . Tobacco abuse   . UTI (lower urinary tract infection)     Patient Active Problem List   Diagnosis Date Noted  . PE (pulmonary embolism) 06/26/2015  . Pulmonary embolism (HCC) 06/26/2015  . Diarrhea 06/26/2015  . Tobacco abuse   . Drug abuse     Past Surgical History:  Procedure Laterality Date  . ECTOPIC PREGNANCY  SURGERY    . WISDOM TOOTH EXTRACTION      OB History    Gravida Para Term Preterm AB Living   5 4 4   1 4    SAB TAB Ectopic Multiple Live Births       1           Home Medications    Prior to Admission medications   Medication Sig Start Date End Date Taking? Authorizing Provider  ibuprofen (ADVIL,MOTRIN) 200 MG tablet Take 400 mg by mouth every 6 (six) hours as needed for fever, headache, mild pain, moderate pain or cramping.   Yes Historical Provider, MD  naproxen (NAPROSYN) 375 MG tablet Take 1 tablet (375 mg total) by mouth 2 (two) times daily. 09/04/16  Yes Arby BarretteMarcy Pfeiffer, MD  omeprazole (PRILOSEC) 20 MG capsule Take 1 capsule (20 mg total) by mouth daily. 09/04/16  Yes Arby BarretteMarcy Pfeiffer, MD  Rivaroxaban (XARELTO) 15 MG TABS tablet Take 1 tablet (15 mg total) by mouth 2 (two) times daily with a meal. 06/27/15 07/16/15  Zannie CovePreetha Joseph, MD  rivaroxaban (XARELTO) 20 MG TABS tablet Take 1 tablet (20 mg total) by mouth daily with supper. Patient not taking: Reported on 09/09/2016 07/17/15   Zannie CovePreetha Joseph, MD    Family History Family History  Problem Relation Age of Onset  .  Cancer Maternal Aunt   . Cancer Maternal Grandmother   . Cancer Maternal Grandfather   . Diabetes Other     Social History Social History  Substance Use Topics  . Smoking status: Current Some Day Smoker    Packs/day: 0.50    Types: Cigarettes  . Smokeless tobacco: Never Used  . Alcohol use Yes     Comment: socially     Allergies   Sulfur and Aspartame and phenylalanine   Review of Systems Review of Systems  Constitutional: Positive for fatigue. Negative for activity change.  Respiratory: Negative for shortness of breath.   Cardiovascular: Positive for chest pain.       Subjective leg swelling.  Gastrointestinal: Negative for abdominal pain.  Endocrine:       Weight gain  Neurological: Positive for dizziness, weakness and numbness.  Psychiatric/Behavioral: Negative for agitation.  All other  systems reviewed and are negative.    Physical Exam Updated Vital Signs BP 119/77 (BP Location: Right Arm)   Pulse 67   Temp 98.6 F (37 C) (Oral)   Resp 16   Ht 5\' 6"  (1.676 m)   Wt 170 lb (77.1 kg)   LMP 08/21/2016   SpO2 100%   BMI 27.44 kg/m   Physical Exam  Constitutional: She is oriented to person, place, and time. She appears well-developed and well-nourished.  HENT:  Head: Normocephalic and atraumatic.  Eyes: Conjunctivae are normal. Right eye exhibits no discharge.  Neck: Neck supple.  Cardiovascular: Normal rate, regular rhythm and normal heart sounds.   No murmur heard. Pulmonary/Chest: Effort normal and breath sounds normal. She has no wheezes. She has no rales.  Mild tenderness on lateral left breast. No lumps.  Abdominal: Soft. She exhibits no distension. There is no tenderness.  Musculoskeletal: Normal range of motion. She exhibits no edema.  No swelling  Neurological: She is oriented to person, place, and time. No cranial nerve deficit.  Skin: Skin is warm and dry. No rash noted. She is not diaphoretic.  Psychiatric: She has a normal mood and affect. Her behavior is normal.  Nursing note and vitals reviewed.    ED Treatments / Results  Labs (all labs ordered are listed, but only abnormal results are displayed) Labs Reviewed  BASIC METABOLIC PANEL - Abnormal; Notable for the following:       Result Value   Glucose, Bld 113 (*)    All other components within normal limits  CBC - Abnormal; Notable for the following:    Hemoglobin 11.0 (*)    HCT 32.6 (*)    RDW 16.7 (*)    All other components within normal limits  I-STAT TROPOININ, ED  I-STAT TROPOININ, ED    EKG  EKG Interpretation  Date/Time:  Wednesday September 09 2016 10:55:11 EST Ventricular Rate:  93 PR Interval:  126 QRS Duration: 90 QT Interval:  348 QTC Calculation: 432 R Axis:   72 Text Interpretation:  Normal sinus rhythm Possible Right ventricular hypertrophy Abnormal ECG  Normal sinus rhythm Since last tracing rate faster Confirmed by Kandis MannanMACKUEN, COURTNEY ([id number]) on 09/09/2016 12:15:45 PM       Radiology Dg Chest 2 View  Result Date: 09/09/2016 CLINICAL DATA:  Chest pain for 4 days EXAM: CHEST  2 VIEW COMPARISON:  09/04/2016 FINDINGS: Normal heart size. Lungs clear. No pneumothorax. No pleural effusion. IMPRESSION: No active cardiopulmonary disease. Electronically Signed   By: Jolaine ClickArthur  Hoss M.D.   On: 09/09/2016 11:42    Procedures Procedures (including critical care  time)  Medications Ordered in ED Medications - No data to display   Initial Impression / Assessment and Plan / ED Course  I have reviewed the triage vital signs and the nursing notes.  Pertinent labs & imaging results that were available during my care of the patient were reviewed by me and considered in my medical decision making (see chart for details).  Clinical Course     Patient is a very pleasant 45 year old female presenting with left-sided chest pain. Patient is Artie had extensive workup for this done earlier this week. Patient found to have no pulmonary embolism, delta troponin is negative. Found to have thyroid nodule which she will continue workup as an outpatient. Patient called her PCP was unable to get appointment until next week and was sent here for further palpation of chest pain. We will get her troponins again. However she has no history of hypertension hyperlipidemia or diabetes. Patient has no family history of early CAD. She therefore she is very few risk factors for either cardiac or disease or stroke. Patient has normal neurologic and physical exam. Patient's vital signs are reassuring. Patient's labs are normal. Patient has a hemoglobin of 11 which is similar to what it wasw 2 years ago.  I'm unsure what is causing this constellation  in symptoms. I'm concerned the left chest wall pain could be secondary to breast process or muscular skeletal pain. We'll have her follow-up  with her primary care physician about this and her thyroid.  Do not see evidence of any emergent process to be addressed as she has normal labs, PE, vitals and is taking PO normally.  Patient's mom called with concerns. I talked to patient's mom to try to reassure her about patient's normal physical exam and lab findings thus far.   Patient is comfortable, ambulatory, and taking PO at time of discharge.  Patient expressed understanding about return precautions.   Final Clinical Impressions(s) / ED Diagnoses   Final diagnoses:  None    New Prescriptions New Prescriptions   No medications on file     Domenic Schoenberger Randall An, MD 09/09/16 1431

## 2016-09-09 NOTE — ED Notes (Signed)
Pt ready to be seen.

## 2017-03-09 ENCOUNTER — Encounter: Payer: Self-pay | Admitting: Neurology

## 2017-05-18 ENCOUNTER — Encounter: Payer: Self-pay | Admitting: Neurology

## 2017-05-18 ENCOUNTER — Ambulatory Visit (INDEPENDENT_AMBULATORY_CARE_PROVIDER_SITE_OTHER): Payer: Medicaid Other | Admitting: Neurology

## 2017-05-18 VITALS — BP 104/70 | HR 88 | Ht 66.0 in | Wt 173.5 lb

## 2017-05-18 DIAGNOSIS — F17200 Nicotine dependence, unspecified, uncomplicated: Secondary | ICD-10-CM | POA: Diagnosis not present

## 2017-05-18 DIAGNOSIS — G43829 Menstrual migraine, not intractable, without status migrainosus: Secondary | ICD-10-CM | POA: Diagnosis not present

## 2017-05-18 MED ORDER — MAGNESIUM CITRATE 200 MG PO TABS: 400.0000 mg | ORAL_TABLET | Freq: Every day | ORAL | 2 refills | Status: DC

## 2017-05-18 MED ORDER — SUMATRIPTAN 20 MG/ACT NA SOLN: NASAL | 2 refills | Status: DC

## 2017-05-18 MED ORDER — RIBOFLAVIN 400 MG PO TABS: 400.0000 mg | ORAL_TABLET | Freq: Every day | ORAL | 2 refills | Status: DC

## 2017-05-18 MED ORDER — COENZYME Q-10 100 MG PO CAPS: 100.0000 mg | ORAL_CAPSULE | Freq: Three times a day (TID) | ORAL | 2 refills | Status: DC

## 2017-05-18 NOTE — Patient Instructions (Signed)
We will try lifestyle change first. 1.  You may drink 2 cups of coffee daily but you must stop soda (Pepsi).  Instead, drink plenty of water. 2.  Take sumatriptan 20mg  1 spray at earliest onset of headache.  May repeat dose once in 2 hours if needed.  Do not exceed two sprays in 24 hours. 3.  Limit use of pain relievers to no more than 2 days out of the week.  These medications include acetaminophen, ibuprofen, triptans and narcotics.  This will help reduce risk of rebound headaches. 4.  Be aware of common food triggers such as processed sweets, processed foods with nitrites (such as deli meat, hot dogs, sausages), foods with MSG, alcohol (such as wine), chocolate, certain cheeses, certain fruits (dried fruits, some citrus fruit), vinegar, diet soda. 5.  Continue exercise 6.  Take the following supplements:  Magnesium citrate 400mg  daily, riboflavin 400mg , Coenzyme Q 10 100mg  three times daily 7.  Follow up in 3 months.

## 2017-05-18 NOTE — Progress Notes (Addendum)
NEUROLOGY CONSULTATION NOTE  Jasmine Christian MRN: [id number] DOB: 1971/07/14  Referring provider: Hillery Jacksanika Lewis, NP Primary care provider: Cari Carawayonna Odem, FNP  Reason for consult:  migraines  HISTORY OF PRESENT ILLNESS: Jasmine Christian is a 46 year old female who is a smoker with history of PE and kidney stones and chronic neck and back pain who presents for headache.  History supplemented by PCP note.  Onset:  Her 20s Location:  Bilateral frontal/temporal and crown Quality:  crushing Intensity:  9/10 Aura:  no Prodrome:  Dizzy, irritable Postdrome:  no Associated symptoms:  Nausea, photophobia, phonophobia, blurred vision.  She has not had any new worse headache of her life, waking up from sleep Duration:  4-5 days (with sumatriptan NS, it immediately dulls the headache to 4-5/10 intensity) Frequency:  Once a month (4-5 days per month about 3 days prior to first day on her menstrual cycle).  More frequent prior to February 2018 when she started Cymbalta. Frequency of abortive medication: 4-5 days a month Triggers/exacerbating factors:  Artificial sweetener, MSG, menstrual cycle Relieving factors:  Caffeine, ice pack on head, heating pad on back Activity:  Cannot function if she doesn't treat it on time.  She has had a couple of severe migraines in the past that presented with shaking and disorientation.  Seizures were at one time considered but ultimately determined to be migrainous.  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  She has chronic neck and back pain, for which she sees a pain specialist.  Past NSAIDS:  Ibuprofen, naproxen Past analgesics:  Tylenol, Excedrin Migraine (helps), Stadol Past abortive triptans:  Sumatriptan tablet (made her feel ill), sumatriptan [place] (effective) Past muscle relaxants:  Methocarbamol, tizanidine Past anti-emetic:  Zofran Past antihypertensive medications:  no Past antidepressant  medications:  no Past anticonvulsant medications:  topiramate (effective) Past vitamins/Herbal/Supplements:  no Other past therapies:  no  Current NSAIDS:  no Current analgesics:  hydrocodone-acetaminophen (for back pain) Current triptans:  sumatriptan 5mg  NS Current anti-emetic:  no Current muscle relaxants:  cyclobenzaprine 5mg  Current anti-anxiolytic:  no Current sleep aide:  trazodone 50mg  Current Antihypertensive medications:  no Current Antidepressant medications:  duloxetine 60mg  Current Anticonvulsant medications:  no Current Vitamins/Herbal/Supplements:  no Current Antihistamines/Decongestants:  no Other therapy:  no  Caffeine:  Coffee daily.  3 12 oz bottles of Pepsi daily Alcohol:  2 days per month Smoker:  yes Diet:  Does not drink enough water.  Tries to stay away from [place] and [place] food Exercise:  yes Depression/anxiety:  Her brother recently passed away but she has been doing better recently. Sleep hygiene:  Varies but better recently.  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 Drug abuse   . Ectopic pregnancy   . Kidney stones   . Migraines   . Seizure (HCC)   . Tobacco abuse   . UTI (lower urinary tract infection)     PAST SURGICAL HISTORY: Past Surgical History:  Procedure Laterality Date  . ECTOPIC PREGNANCY SURGERY    . WISDOM TOOTH EXTRACTION      MEDICATIONS: Current Outpatient Prescriptions on File Prior to Visit  Medication Sig Dispense Refill  . cyclobenzaprine (FLEXERIL) 10 MG tablet Take 1 tablet (10 mg total) by mouth 2 (two) times daily as needed for muscle spasms. 10 tablet 0  . ibuprofen (ADVIL,MOTRIN)  200 MG tablet Take 400 mg by mouth every 6 (six) hours as needed for fever, headache, mild pain, moderate pain or cramping.    Marland Kitchen omeprazole (PRILOSEC) 20 MG capsule Take 1 capsule (20 mg total) by mouth daily. 14 capsule 1    . oxyCODONE-acetaminophen (PERCOCET/ROXICET) 5-325 MG tablet Take 1 tablet by mouth every 6 (six) hours as needed for severe pain. 11 tablet 0   No current facility-administered medications on file prior to visit.     ALLERGIES: Allergies  Allergen Reactions  . Sulfur Anaphylaxis  . Aspartame And Phenylalanine Swelling    Swelling of throat, fever (artificial sweetener)    FAMILY HISTORY: Family History  Problem Relation Age of Onset  . Cancer Maternal Aunt   . Cancer Maternal Grandmother   . Cancer Maternal Grandfather   . Diabetes Other     SOCIAL HISTORY: Social History   Social History  . Marital status: Single    Spouse name: N/A  . Number of children: 4  . Years of education: 15   Occupational History  .  [employer]   Social History Main Topics  . Smoking status: Current Some Day Smoker    Packs/day: 0.50    Types: Cigarettes  . Smokeless tobacco: Never Used  . Alcohol use Yes     Comment: socially  . Drug use: Yes    Types: Marijuana  . Sexual activity: Not on file   Other Topics Concern  . Not on file   Social History Narrative   Lives with her 4 children in a one story home.  Works as a Engineer, structural for [employer].  Education: 3 years of college.     REVIEW OF SYSTEMS: Constitutional: No fevers, chills, or sweats, no generalized fatigue, change in appetite Eyes: No visual changes, double vision, eye pain Ear, nose and throat: No hearing loss, ear pain, nasal congestion, sore throat Cardiovascular: No chest pain, palpitations Respiratory:  No shortness of breath at rest or with exertion, wheezes GastrointestinaI: No nausea, vomiting, diarrhea, abdominal pain, fecal incontinence Genitourinary:  No dysuria, urinary retention or frequency Musculoskeletal:  No neck pain, back pain Integumentary: No rash, pruritus, skin lesions Neurological: as above Psychiatric: No depression, insomnia, anxiety Endocrine: No palpitations, fatigue,  diaphoresis, mood swings, change in appetite, change in weight, increased thirst Hematologic/Lymphatic:  No purpura, petechiae. Allergic/Immunologic: no itchy/runny eyes, nasal congestion, recent allergic reactions, rashes  PHYSICAL EXAM: Vitals:   05/18/17 1016  BP: 104/70  Pulse: 88   General: No acute distress.  Patient appears well-groomed.  Head:  Normocephalic/atraumatic Eyes:  fundi examined but not visualized Neck: supple, no paraspinal tenderness, full range of motion Back: No paraspinal tenderness Heart: regular rate and rhythm Lungs: Clear to auscultation bilaterally. Vascular: No carotid bruits. Neurological Exam: Mental status: alert and oriented to person, place, and time, recent and remote memory intact, fund of knowledge intact, attention and concentration intact, speech fluent and not dysarthric, language intact. Cranial nerves: CN I: not tested CN II: pupils equal, round and reactive to light, visual fields intact CN III, IV, VI:  full range of motion, no nystagmus, no ptosis CN V: facial sensation intact CN VII: upper and lower face symmetric CN VIII: hearing intact CN IX, X: gag intact, uvula midline CN XI: sternocleidomastoid and trapezius muscles intact CN XII: tongue midline Bulk & Tone: normal, no fasciculations. Motor:  5/5 throughout  Sensation: Pinprick and vibration sensation intact. Deep Tendon Reflexes:  2+ throughout, toes downgoing.  Finger to  nose testing:  Without dysmetria.  Heel to shin:  Without dysmetria.  Gait:  Normal station and stride.  Able to turn and tandem walk. Romberg negative.  IMPRESSION: Menstrual migraines Cigarette smoker  PLAN: 1.  We will first focus on lifestyle modification:  Diet, increase water intake, stop Pepsi (may continue 2 cups coffee daily), continue exercise, smoking cessation 2.  She will try magnesium citrate, riboflavin and CoQ10 3.  Sumatriptan 20mg  NS for abortive therapy 4. Continue Cymbalta 60mg   daily, as it has helped reduce frequency of migraines.  5. She will return in 3 months.  If not improved, consider adding another preventative. 6.  Will get notes from prior neurologist  Thank you for allowing me to take part in the care of this patient.  Shon Millet, DO  CC:  Cari Caraway, FNP  Hillery Jacks, NP

## 2017-08-20 ENCOUNTER — Ambulatory Visit (INDEPENDENT_AMBULATORY_CARE_PROVIDER_SITE_OTHER): Payer: Self-pay | Admitting: Neurology

## 2017-08-20 ENCOUNTER — Encounter: Payer: Self-pay | Admitting: Neurology

## 2017-08-20 VITALS — BP 108/76 | HR 70 | Ht 66.5 in | Wt 173.8 lb

## 2017-08-20 DIAGNOSIS — G43829 Menstrual migraine, not intractable, without status migrainosus: Secondary | ICD-10-CM

## 2017-08-20 DIAGNOSIS — F17200 Nicotine dependence, unspecified, uncomplicated: Secondary | ICD-10-CM

## 2017-08-20 MED ORDER — NAPROXEN 500 MG PO TABS: ORAL_TABLET | ORAL | 0 refills | Status: DC

## 2017-08-20 MED ORDER — TOPIRAMATE 50 MG PO TABS: 50.0000 mg | ORAL_TABLET | Freq: Every day | ORAL | 2 refills | Status: DC

## 2017-08-20 NOTE — Progress Notes (Signed)
NEUROLOGY FOLLOW UP OFFICE NOTE  Jasmine Christian [id number]  HISTORY OF PRESENT ILLNESS: Jasmine Christian is a 46 year old female who is a smoker with history of PE and kidney stones and chronic neck and back pain who follows up for menstrual migraines.  UPDATE: She discontinued Cymbalta about a month ago.  Overall, intensity of migraines improved but they still occur for 4-5 days beginning 3 days prior to first day of menstrual cycle.  Sumatriptan 20mg  nasal spray decreases intensity significantly within 30 minutes but still lingers for several hours.  Today is the first day of her migraines over the next couple of weeks. Current NSAIDS:  no Current analgesics:  hydrocodone-acetaminophen (for back pain) Current triptans:  sumatriptan 20mg  NS Current anti-emetic:  no Current muscle relaxants:  cyclobenzaprine 5mg  Current anti-anxiolytic:  no Current sleep aide:  trazodone 50mg  Current Antihypertensive medications:  no Current Antidepressant medications:  no Current Anticonvulsant medications:  no Current Vitamins/Herbal/Supplements:  magnesium, riboflavin, CoQ10 Current Antihistamines/Decongestants:  no Other therapy:  no   Caffeine:  Coffee daily.  3 12 oz bottles of Pepsi daily Alcohol:  2 days per month Smoker:  yes Diet:  Does not drink enough water.  Tries to stay away from [place] and [place] food Exercise:  yes Depression/anxiety:  okay Sleep hygiene:  Varies    HISTORY: Onset:  Her 20s Location:  Bilateral frontal/temporal and crown Quality:  crushing Initial Intensity:  9/10 Aura:  no Prodrome:  Dizzy, irritable Postdrome:  no Associated symptoms:  Nausea, photophobia, phonophobia, blurred vision.  She has not had any new worse headache of her life, waking up from sleep Initial Duration:  4-5 days (with sumatriptan NS, it immediately dulls the headache to 4-5/10 intensity) Initial Frequency:  Once a month (4-5 days per month about 3 days prior to first day  on her menstrual cycle).  More frequent prior to February 2018 when she started Cymbalta. Frequency of abortive medication: 4-5 days a month Triggers/exacerbating factors:  Artificial sweetener, MSG, menstrual cycle Relieving factors:  Caffeine, ice pack on head, heating pad on back Activity:  Cannot function if she doesn't treat it on time.   She has had a couple of severe migraines in the past that presented with shaking and disorientation.  Seizures were at one time considered but ultimately determined to be migrainous.  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  She has chronic neck and back pain, for which she sees a pain specialist.   Past NSAIDS:  Ibuprofen, naproxen Past analgesics:  Tylenol, Excedrin Migraine (helps), Stadol Past abortive triptans:  Sumatriptan tablet (made her feel ill), sumatriptan [place] (effective) Past muscle relaxants:  Methocarbamol, tizanidine Past anti-emetic:  Zofran Past antihypertensive medications:  no Past antidepressant medications:  no Past anticonvulsant medications:  topiramate (effective) Past vitamins/Herbal/Supplements:  no Other past therapies:  no   Family history of headache:  Children, sister  PAST MEDICAL HISTORY: Past Medical History:  Diagnosis Date  . Drug abuse (HCC)   . Ectopic pregnancy   . Kidney stones   . Migraines   . Seizure (HCC)   . Tobacco abuse   . UTI (lower urinary tract infection)     MEDICATIONS: Current Outpatient Prescriptions on File Prior to Visit  Medication Sig Dispense Refill  . Coenzyme Q-10 100 MG capsule Take 1 capsule (100 mg total) by mouth 3 (three) times daily. (Patient not taking: Reported on 08/20/2017) 90 capsule  2  . cyclobenzaprine (FLEXERIL) 10 MG tablet Take 1 tablet (10 mg total) by mouth 2 (two) times daily as needed for muscle spasms. (Patient not taking: Reported on 08/20/2017) 10 tablet 0  . DULoxetine (CYMBALTA) 30 MG capsule   1  .  ibuprofen (ADVIL,MOTRIN) 200 MG tablet Take 400 mg by mouth every 6 (six) hours as needed for fever, headache, mild pain, moderate pain or cramping.    . Magnesium Citrate 200 MG TABS Take 400 mg by mouth daily. 60 tablet 2  . methocarbamol (ROBAXIN) 500 MG tablet Take 500 mg by mouth every 6 (six) hours as needed for muscle spasms.    Marland Kitchen omeprazole (PRILOSEC) 20 MG capsule Take 1 capsule (20 mg total) by mouth daily. (Patient not taking: Reported on 08/20/2017) 14 capsule 1  . oxyCODONE-acetaminophen (PERCOCET/ROXICET) 5-325 MG tablet Take 1 tablet by mouth every 6 (six) hours as needed for severe pain. (Patient not taking: Reported on 08/20/2017) 11 tablet 0  . Riboflavin 400 MG TABS Take 400 mg by mouth daily. 30 tablet 2  . SUMAtriptan (IMITREX) 20 MG/ACT nasal spray 1 spray at earliest onset of migraine.  May repeat once in 2 hours if headache persists or recurs. 1 Inhaler 2  . tiZANidine (ZANAFLEX) 4 MG capsule Take 4 mg by mouth 3 (three) times daily as needed for muscle spasms.    . traZODone (DESYREL) 50 MG tablet   1   No current facility-administered medications on file prior to visit.     ALLERGIES: Allergies  Allergen Reactions  . Sulfur Anaphylaxis  . Aspartame And Phenylalanine Swelling    Swelling of throat, fever (artificial sweetener)    FAMILY HISTORY: Family History  Problem Relation Age of Onset  . Cancer Maternal Aunt   . Cancer Maternal Grandmother   . Cancer Maternal Grandfather   . Diabetes Other     SOCIAL HISTORY: Social History   Social History  . Marital status: Single    Spouse name: N/A  . Number of children: 4  . Years of education: 15   Occupational History  .  [employer]   Social History Main Topics  . Smoking status: Current Some Day Smoker    Packs/day: 0.50    Types: Cigarettes  . Smokeless tobacco: Never Used  . Alcohol use Yes     Comment: socially  . Drug use: Yes    Types: Marijuana  . Sexual activity: Not on file    Other Topics Concern  . Not on file   Social History Narrative   Lives with her 4 children in a one story home.  Works as a Engineer, structural for [employer].  Education: 3 years of college.     REVIEW OF SYSTEMS: Constitutional: No fevers, chills, or sweats, no generalized fatigue, change in appetite Eyes: No visual changes, double vision, eye pain Ear, nose and throat: No hearing loss, ear pain, nasal congestion, sore throat Cardiovascular: No chest pain, palpitations Respiratory:  No shortness of breath at rest or with exertion, wheezes GastrointestinaI: No nausea, vomiting, diarrhea, abdominal pain, fecal incontinence Genitourinary:  No dysuria, urinary retention or frequency Musculoskeletal:  No neck pain, back pain Integumentary: No rash, pruritus, skin lesions Neurological: as above Psychiatric: No depression, insomnia, anxiety Endocrine: No palpitations, fatigue, diaphoresis, mood swings, change in appetite, change in weight, increased thirst Hematologic/Lymphatic:  No purpura, petechiae. Allergic/Immunologic: no itchy/runny eyes, nasal congestion, recent allergic reactions, rashes  PHYSICAL EXAM: Vitals:   08/20/17 1112  BP: 108/76  Pulse: 70  SpO2: 98%   General: No acute distress.  Patient appears well-groomed.  Head:  Normocephalic/atraumatic Eyes:  Fundi examined but not visualized Neck: supple, no paraspinal tenderness, full range of motion Heart:  Regular rate and rhythm Lungs:  Clear to auscultation bilaterally Back: No paraspinal tenderness Neurological Exam: alert and oriented to person, place, and time. Attention span and concentration intact, recent and remote memory intact, fund of knowledge intact.  Speech fluent and not dysarthric, language intact.  CN II-XII intact. Bulk and tone normal, muscle strength 5/5 throughout.  Sensation to light touch  intact.  Deep tendon reflexes 2+ throughout.  Finger to nose testing intact.  Gait normal, Romberg  negative.  IMPRESSION: Menstrual migraines  PLAN: 1.  Will start topiramate 50mg  at bedtime.  She has remote history of kidney stones.  I told her the small risk and instructed her to stay hydrated with water. 2.  Will have her start naproxen 500mg  twice daily for 14 days as a mini prophylaxis 3.  She will still take sumatriptan 20mg  NS for abortive therapy 4.  Smoking cessation instruction/counseling given:  counseled patient on the dangers of tobacco use, advised patient to stop smoking, and reviewed strategies to maximize success. 5.  Follow up in 3 months.  Shon MilletAdam Delilah Mulgrew, DO  CC: Cari Carawayonna Odem, FNP

## 2017-08-20 NOTE — Patient Instructions (Signed)
1.  We will start topiramate 50mg  at bedtime to reduce frequency of migraines.  Keep hydrated with water as there is a small risk for developing kidney stones. 2.  To try and prevent migraines for this menstrual cycle, take naproxen 500mg  twice daily with food for the next 14 days. 3.  If you get a migraine, you may use the sumatriptan spray 4.  Try to quit smoking again 5.  Follow up in 3 months.

## 2017-10-21 ENCOUNTER — Ambulatory Visit: Payer: Medicaid Other | Admitting: Obstetrics

## 2017-11-07 ENCOUNTER — Other Ambulatory Visit: Payer: Self-pay

## 2017-11-07 ENCOUNTER — Encounter (HOSPITAL_COMMUNITY): Payer: Self-pay | Admitting: Nurse Practitioner

## 2017-11-07 ENCOUNTER — Emergency Department (HOSPITAL_COMMUNITY)
Admission: EM | Admit: 2017-11-07 | Discharge: 2017-11-08 | Disposition: A | Discharge status: Home / Self Care | Payer: Medicaid Other | Attending: Emergency Medicine | Admitting: Emergency Medicine

## 2017-11-07 DIAGNOSIS — Z79899 Other long term (current) drug therapy: Secondary | ICD-10-CM | POA: Insufficient documentation

## 2017-11-07 DIAGNOSIS — N390 Urinary tract infection, site not specified: Secondary | ICD-10-CM | POA: Insufficient documentation

## 2017-11-07 DIAGNOSIS — F17210 Nicotine dependence, cigarettes, uncomplicated: Secondary | ICD-10-CM | POA: Insufficient documentation

## 2017-11-07 LAB — URINALYSIS, ROUTINE W REFLEX MICROSCOPIC

## 2017-11-07 LAB — CBC
HCT: 33.7 % — ABNORMAL LOW (ref 36.0–46.0)
Hemoglobin: 10.9 g/dL — ABNORMAL LOW (ref 12.0–15.0)
MCH: 25.4 pg — ABNORMAL LOW (ref 26.0–34.0)
MCHC: 32.3 g/dL (ref 30.0–36.0)
MCV: 78.6 fL (ref 78.0–100.0)
PLATELETS: 295 10*3/uL (ref 150–400)
RBC: 4.29 MIL/uL (ref 3.87–5.11)
RDW: 16.3 % — ABNORMAL HIGH (ref 11.5–15.5)
WBC: 8.9 10*3/uL (ref 4.0–10.5)

## 2017-11-07 LAB — URINALYSIS, MICROSCOPIC (REFLEX)

## 2017-11-07 LAB — BASIC METABOLIC PANEL
Anion gap: 7 (ref 5–15)
BUN: 15 mg/dL (ref 6–20)
CALCIUM: 9.4 mg/dL (ref 8.9–10.3)
CO2: 23 mmol/L (ref 22–32)
CREATININE: 0.89 mg/dL (ref 0.44–1.00)
Chloride: 110 mmol/L (ref 101–111)
Glucose, Bld: 91 mg/dL (ref 65–99)
Potassium: 3.9 mmol/L (ref 3.5–5.1)
SODIUM: 140 mmol/L (ref 135–145)

## 2017-11-07 LAB — I-STAT BETA HCG BLOOD, ED (MC, WL, AP ONLY)

## 2017-11-07 NOTE — ED Triage Notes (Signed)
Pt is c/o urinary symptoms of frequency, dysuria and nausea. Remarks of Kidney stones and frequent UTIs.

## 2017-11-08 MED ORDER — CEPHALEXIN 500 MG PO CAPS: 500.0000 mg | ORAL_CAPSULE | Freq: Two times a day (BID) | ORAL | 0 refills | Status: DC

## 2017-11-08 NOTE — Discharge Instructions (Signed)
Please read attached information. If you experience any new or worsening signs or symptoms please return to the emergency room for evaluation. Please follow-up with your primary care provider or specialist as discussed. Please use medication prescribed only as directed and discontinue taking if you have any concerning signs or symptoms.   °

## 2017-11-08 NOTE — ED Notes (Addendum)
Patient is complaining of with painful urination. Patient is taking an over the counter Uristat Ultra. This is what is making her urine orange. Patient states this started 5 days ago.

## 2017-11-08 NOTE — ED Provider Notes (Signed)
[facility]-EMERGENCY DEPT Provider Note   CSN: [id number] Arrival date & time: 11/07/17  1532   History   Chief Complaint Chief Complaint  Patient presents with  . Dysuria    HPI Jasmine Christian is a 47 y.o. female.  HPI  47 year old female presents today with complaints of dysuria.  Patient notes symptoms started approximately 5 days ago with painful urinary movements.  She denies any odor or change in color to her urine.  She reports minor suprapubic discomfort.  Patient also reports pain to her bilateral upper lower back, denies flank pain nausea vomiting or fever.  Reports history of urinary tract infections in the past, but has not had one in the last year.  She notes she has been using over-the-counter Uristat.  Past Medical History:  Diagnosis Date  . Drug abuse (HCC)   . Ectopic pregnancy   . Kidney stones   . Migraines   . Seizure (HCC)   . Tobacco abuse   . UTI (lower urinary tract infection)     Patient Active Problem List   Diagnosis Date Noted  . PE (pulmonary embolism) 06/26/2015  . Pulmonary embolism (HCC) 06/26/2015  . Diarrhea 06/26/2015  . Tobacco abuse   . Drug abuse [facility])     Past Surgical History:  Procedure Laterality Date  . ECTOPIC PREGNANCY SURGERY    . WISDOM TOOTH EXTRACTION      OB History    Gravida Para Term Preterm AB Living   5 4 4   1 4    SAB TAB Ectopic Multiple Live Births       1           Home Medications    Prior to Admission medications   Medication Sig Start Date End Date Taking? Authorizing Provider  cephALEXin (KEFLEX) 500 MG capsule Take 1 capsule (500 mg total) by mouth 2 (two) times daily. 11/08/17   Alijah Akram, Tinnie Gens, PA-C  Coenzyme Q-10 100 MG capsule Take 1 capsule (100 mg total) by mouth 3 (three) times daily. Patient not taking: Reported on 08/20/2017 05/18/17   Drema Dallas, DO  cyclobenzaprine (FLEXERIL) 10 MG tablet Take 1 tablet (10 mg total) by mouth 2 (two) times daily as needed  for muscle spasms. Patient not taking: Reported on 08/20/2017 09/09/16   Abelino Derrick, MD  DULoxetine (CYMBALTA) 30 MG capsule  03/26/17   [provider]  ibuprofen (ADVIL,MOTRIN) 200 MG tablet Take 400 mg by mouth every 6 (six) hours as needed for fever, headache, mild pain, moderate pain or cramping.    [provider]  Magnesium Citrate 200 MG TABS Take 400 mg by mouth daily. 05/18/17   Drema Dallas, DO  methocarbamol (ROBAXIN) 500 MG tablet Take 500 mg by mouth every 6 (six) hours as needed for muscle spasms.    [provider]  naproxen (NAPROSYN) 500 MG tablet Take 1 tablet every 12 hours with food for 14 days. 08/20/17   Drema Dallas, DO  omeprazole (PRILOSEC) 20 MG capsule Take 1 capsule (20 mg total) by mouth daily. Patient not taking: Reported on 08/20/2017 09/04/16   Arby Barrette, MD  oxyCODONE-acetaminophen (PERCOCET/ROXICET) 5-325 MG tablet Take 1 tablet by mouth every 6 (six) hours as needed for severe pain. Patient not taking: Reported on 08/20/2017 09/09/16   Mackuen, Cindee Salt, MD  Riboflavin 400 MG TABS Take 400 mg by mouth daily. 05/18/17   Drema Dallas, DO  SUMAtriptan (IMITREX) 20 MG/ACT nasal  spray 1 spray at earliest onset of migraine.  May repeat once in 2 hours if headache persists or recurs. 05/18/17   Drema DallasJaffe, Adam R, DO  tiZANidine (ZANAFLEX) 4 MG capsule Take 4 mg by mouth 3 (three) times daily as needed for muscle spasms.    [provider]  topiramate (TOPAMAX) 50 MG tablet Take 1 tablet (50 mg total) by mouth at bedtime. 08/20/17   Drema DallasJaffe, Adam R, DO  traZODone (DESYREL) 50 MG tablet  03/26/17   [provider]    Family History Family History  Problem Relation Age of Onset  . Cancer Maternal Aunt   . Cancer Maternal Grandmother   . Cancer Maternal Grandfather   . Diabetes Other     Social History Social History   Tobacco Use  . Smoking status: Current Some Day Smoker    Packs/day: 0.50    Types:  Cigarettes  . Smokeless tobacco: Never Used  Substance Use Topics  . Alcohol use: Yes    Comment: socially  . Drug use: Yes    Types: Marijuana     Allergies   Sulfur and Aspartame and phenylalanine   Review of Systems Review of Systems  All other systems reviewed and are negative.    Physical Exam Updated Vital Signs BP 122/78 (BP Location: Right Arm)   Pulse 60   Temp 98.8 F (37.1 C) (Oral)   Resp 15   Ht 5\' 6"  (1.676 m)   Wt 74.8 kg (165 lb)   SpO2 97%   BMI 26.63 kg/m   Physical Exam  Constitutional: She is oriented to person, place, and time. She appears well-developed and well-nourished.  HENT:  Head: Normocephalic and atraumatic.  Eyes: Conjunctivae are normal. Pupils are equal, round, and reactive to light. Right eye exhibits no discharge. Left eye exhibits no discharge. No scleral icterus.  Neck: Normal range of motion. No JVD present. No tracheal deviation present.  Pulmonary/Chest: Effort normal. No stridor.  Abdominal: Soft. She exhibits no distension and no mass. There is no tenderness. There is no rebound and no guarding. No hernia.  Musculoskeletal:  Minor tenderness palpation of the bilateral lower lumbar soft tissue, no CVA tenderness  Neurological: She is alert and oriented to person, place, and time. Coordination normal.  Psychiatric: She has a normal mood and affect. Her behavior is normal. Judgment and thought content normal.  Nursing note and vitals reviewed.    ED Treatments / Results  Labs (all labs ordered are listed, but only abnormal results are displayed) Labs Reviewed  URINALYSIS, ROUTINE W REFLEX MICROSCOPIC - Abnormal; Notable for the following components:      Result Value   Color, Urine ORANGE (*)    APPearance HAZY (*)    Glucose, UA   (*)    Value: TEST NOT REPORTED DUE TO COLOR INTERFERENCE OF URINE PIGMENT   Hgb urine dipstick   (*)    Value: TEST NOT REPORTED DUE TO COLOR INTERFERENCE OF URINE PIGMENT   Bilirubin  Urine   (*)    Value: TEST NOT REPORTED DUE TO COLOR INTERFERENCE OF URINE PIGMENT   Ketones, ur   (*)    Value: TEST NOT REPORTED DUE TO COLOR INTERFERENCE OF URINE PIGMENT   Protein, ur   (*)    Value: TEST NOT REPORTED DUE TO COLOR INTERFERENCE OF URINE PIGMENT   Nitrite   (*)    Value: TEST NOT REPORTED DUE TO COLOR INTERFERENCE OF URINE PIGMENT   Leukocytes, UA   (*)  Value: TEST NOT REPORTED DUE TO COLOR INTERFERENCE OF URINE PIGMENT   All other components within normal limits  CBC - Abnormal; Notable for the following components:   Hemoglobin 10.9 (*)    HCT 33.7 (*)    MCH 25.4 (*)    RDW 16.3 (*)    All other components within normal limits  URINALYSIS, MICROSCOPIC (REFLEX) - Abnormal; Notable for the following components:   Bacteria, UA MANY (*)    Squamous Epithelial / LPF 6-30 (*)    All other components within normal limits  URINE CULTURE  BASIC METABOLIC PANEL  I-STAT BETA HCG BLOOD, ED ([facility], [facility], [facility] ONLY)    EKG  EKG Interpretation None       Radiology No results found.  Procedures Procedures (including critical care time)  Medications Ordered in ED Medications - No data to display   Initial Impression / Assessment and Plan / ED Course  I have reviewed the triage vital signs and the nursing notes.  Pertinent labs & imaging results that were available during my care of the patient were reviewed by me and considered in my medical decision making (see chart for details).      Final Clinical Impressions(s) / ED Diagnoses   Final diagnoses:  Urinary tract infection without hematuria, site unspecified   Labs: I-STAT beta-hCG, BMP, CBC, urinalysis  Imaging:  Consults:  Therapeutics:  Discharge Meds: Keflex  Assessment/Plan: 47 year old female presents today with complaints of dysuria.  This is most likely urinary tract infection, difficult to use urinalysis given the contaminates.  Patient afebrile in no acute distress, no signs of  pyelonephritis, no significant abdominal pain.  Patient will be given antibiotics, encouraged to return immediately with any new or worsening signs or symptoms.  She verbalized understanding and agreement to today's plan had no further questions or concerns.      ED Discharge Orders        Ordered    cephALEXin (KEFLEX) 500 MG capsule  2 times daily     11/08/17 0031       Eyvonne Mechanic, PA-C 11/08/17 0033    Molpus, Jonny Ruiz, MD 11/08/17 0730

## 2017-11-09 LAB — URINE CULTURE

## 2017-12-08 ENCOUNTER — Ambulatory Visit: Payer: Medicaid Other | Admitting: Neurology

## 2018-03-30 ENCOUNTER — Emergency Department (HOSPITAL_COMMUNITY)
Admission: EM | Admit: 2018-03-30 | Discharge: 2018-03-31 | Disposition: A | Discharge status: Other Acute Inpatient Hospital | Payer: Medicaid Other | Attending: Emergency Medicine | Admitting: Emergency Medicine

## 2018-03-30 ENCOUNTER — Other Ambulatory Visit: Payer: Self-pay

## 2018-03-30 ENCOUNTER — Encounter (HOSPITAL_COMMUNITY): Payer: Self-pay

## 2018-03-30 ENCOUNTER — Emergency Department (HOSPITAL_COMMUNITY): Payer: Medicaid Other

## 2018-03-30 DIAGNOSIS — F322 Major depressive disorder, single episode, severe without psychotic features: Secondary | ICD-10-CM

## 2018-03-30 DIAGNOSIS — Z79899 Other long term (current) drug therapy: Secondary | ICD-10-CM | POA: Insufficient documentation

## 2018-03-30 DIAGNOSIS — Z046 Encounter for general psychiatric examination, requested by authority: Secondary | ICD-10-CM | POA: Insufficient documentation

## 2018-03-30 DIAGNOSIS — F419 Anxiety disorder, unspecified: Secondary | ICD-10-CM | POA: Insufficient documentation

## 2018-03-30 DIAGNOSIS — R45851 Suicidal ideations: Secondary | ICD-10-CM | POA: Insufficient documentation

## 2018-03-30 DIAGNOSIS — F17210 Nicotine dependence, cigarettes, uncomplicated: Secondary | ICD-10-CM | POA: Insufficient documentation

## 2018-03-30 LAB — CBC
HCT: 34.2 % — ABNORMAL LOW (ref 36.0–46.0)
Hemoglobin: 11.1 g/dL — ABNORMAL LOW (ref 12.0–15.0)
MCH: 25.8 pg — AB (ref 26.0–34.0)
MCHC: 32.5 g/dL (ref 30.0–36.0)
MCV: 79.4 fL (ref 78.0–100.0)
PLATELETS: 288 10*3/uL (ref 150–400)
RBC: 4.31 MIL/uL (ref 3.87–5.11)
RDW: 16.3 % — ABNORMAL HIGH (ref 11.5–15.5)
WBC: 9.7 10*3/uL (ref 4.0–10.5)

## 2018-03-30 LAB — COMPREHENSIVE METABOLIC PANEL
ALK PHOS: 62 U/L (ref 38–126)
ALT: 15 U/L (ref 14–54)
AST: 19 U/L (ref 15–41)
Albumin: 4.1 g/dL (ref 3.5–5.0)
Anion gap: 10 (ref 5–15)
BILIRUBIN TOTAL: 0.4 mg/dL (ref 0.3–1.2)
BUN: 17 mg/dL (ref 6–20)
CALCIUM: 9.3 mg/dL (ref 8.9–10.3)
CO2: 21 mmol/L — ABNORMAL LOW (ref 22–32)
CREATININE: 0.91 mg/dL (ref 0.44–1.00)
Chloride: 108 mmol/L (ref 101–111)
Glucose, Bld: 98 mg/dL (ref 65–99)
Potassium: 3.5 mmol/L (ref 3.5–5.1)
Sodium: 139 mmol/L (ref 135–145)
Total Protein: 7.3 g/dL (ref 6.5–8.1)

## 2018-03-30 LAB — I-STAT BETA HCG BLOOD, ED (MC, WL, AP ONLY)

## 2018-03-30 LAB — ETHANOL

## 2018-03-30 LAB — I-STAT TROPONIN, ED: Troponin i, poc: 0 ng/mL (ref 0.00–0.08)

## 2018-03-30 LAB — RAPID URINE DRUG SCREEN, HOSP PERFORMED
AMPHETAMINES: NOT DETECTED
Barbiturates: NOT DETECTED
Benzodiazepines: NOT DETECTED
Cocaine: NOT DETECTED
OPIATES: NOT DETECTED
TETRAHYDROCANNABINOL: POSITIVE — AB

## 2018-03-30 LAB — ACETAMINOPHEN LEVEL: Acetaminophen (Tylenol), Serum: <10 ug/mL — ABNORMAL LOW (ref 10–30)

## 2018-03-30 LAB — D-DIMER, QUANTITATIVE (NOT AT ARMC): D DIMER QUANT: 0.38 ug{FEU}/mL (ref 0.00–0.50)

## 2018-03-30 LAB — SALICYLATE LEVEL

## 2018-03-30 MED ORDER — IBUPROFEN 800 MG PO TABS: 800.0000 mg | ORAL_TABLET | Freq: Once | ORAL | Status: DC

## 2018-03-30 MED ORDER — IBUPROFEN 200 MG PO TABS
600.0000 mg | ORAL_TABLET | Freq: Once | ORAL | Status: AC
  Administered 2018-03-30: 600 mg via ORAL
  Filled 2018-03-30: qty 3

## 2018-03-30 NOTE — ED Triage Notes (Signed)
She tells me she has had issues with depressing, which she has had before. She states she is dealing with her son having recently being charged with murder. She also cites hx of a relative in [place] being murdered in the past. She also tells me she has had prior treatment with antidepressants "which really helped, but I can't remember the name". She denies any specific plan to harm herself, but she tells me "My pain is so bad right now I don't know how to escape it other than death". She is well-kept and healthy-looking in appearance. She has an older female family member with her who is very helpful and supportive.

## 2018-03-30 NOTE — ED Notes (Signed)
Bed: WA26 Expected date:  Expected time:  Means of arrival:  Comments: EMS 

## 2018-03-30 NOTE — ED Provider Notes (Signed)
[facility]-EMERGENCY DEPT Provider Note   CSN: [id number] Arrival date & time: 03/30/18  1730     History   Chief Complaint Chief Complaint  Patient presents with  . Suicidal    HPI Jasmine Christian is a 47 y.o. female presenting for evaluation of depression and suicidal thoughts.  Patient states that throughout the course of her life, she is struggled with anxiety and depression.  She has multiple factors which have made her more stressed and anxious recently, including a son who was arrested for murder, 68 year old pregnant daughter, and the death of a family member last year.  She states that she is unable to cope with everything that has been happening.  She was on BuSpar many years ago, but reported she did not tolerate this well.  She was on another medication, she thinks Cymbalta, which helped for short period of time, but she is not sure if this is the name.  She states that she wants to live, but she is frequently having thoughts that her pain would go away if she is dead.  She reports she is not eating or sleeping well.  She reports alcohol use last night, states that she only uses every couple months.  Smokes marijuana occasionally, last smoked several weeks ago.  Has started smoking cigarettes again, does not need a nicotine patch.  Denies other drug use.  She has a history of migraines, no other medical problems.  She does not take any medications daily, as she lost her insurance.  She was unable to go to work today due to how depressed and upset she was. Denies HI or AVH. Denies plan for suicide, but, as stated before, thinks about how death would be easier.   She denies recent fevers, chills, cough, nausea, vomiting, abdominal pain, urinary symptoms, abnormal bowel movements.  She reports a chest pressure which is abnormal for her.  When she has anxiety, she often has shooting pains, which she is also having, but the pressure is new, and started a few weeks  ago.  Per chart review, patient has a history of PE, not on blood thinners.      Past Medical History:  Diagnosis Date  . Drug abuse (HCC)   . Ectopic pregnancy   . Kidney stones   . Migraines   . Seizure (HCC)   . Tobacco abuse   . UTI (lower urinary tract infection)     Patient Active Problem List   Diagnosis Date Noted  . PE (pulmonary embolism) 06/26/2015  . Pulmonary embolism (HCC) 06/26/2015  . Diarrhea 06/26/2015  . Tobacco abuse   . Drug abuse [facility])     Past Surgical History:  Procedure Laterality Date  . ECTOPIC PREGNANCY SURGERY    . WISDOM TOOTH EXTRACTION       OB History    Gravida  5   Para  4   Term  4   Preterm      AB  1   Living  4     SAB      TAB      Ectopic  1   Multiple      Live Births               Home Medications    Prior to Admission medications   Medication Sig Start Date End Date Taking? Authorizing Provider  DULoxetine (CYMBALTA) 30 MG capsule Take 60 mg by mouth daily.  03/26/17  Yes [provider]  cephALEXin (KEFLEX) 500 MG capsule Take 1 capsule (500 mg total) by mouth 2 (two) times daily. Patient not taking: Reported on 03/30/2018 11/08/17   Hedges, Tinnie Gens, PA-C  Coenzyme Q-10 100 MG capsule Take 1 capsule (100 mg total) by mouth 3 (three) times daily. Patient not taking: Reported on 08/20/2017 05/18/17   Drema Dallas, DO  cyclobenzaprine (FLEXERIL) 10 MG tablet Take 1 tablet (10 mg total) by mouth 2 (two) times daily as needed for muscle spasms. Patient not taking: Reported on 08/20/2017 09/09/16   Mackuen, Courteney Lyn, MD  Magnesium Citrate 200 MG TABS Take 400 mg by mouth daily. Patient not taking: Reported on 03/30/2018 05/18/17   Drema Dallas, DO  naproxen (NAPROSYN) 500 MG tablet Take 1 tablet every 12 hours with food for 14 days. Patient not taking: Reported on 03/30/2018 08/20/17   Drema Dallas, DO  omeprazole (PRILOSEC) 20 MG capsule Take 1 capsule (20 mg total) by mouth daily. Patient not  taking: Reported on 08/20/2017 09/04/16   Arby Barrette, MD  oxyCODONE-acetaminophen (PERCOCET/ROXICET) 5-325 MG tablet Take 1 tablet by mouth every 6 (six) hours as needed for severe pain. Patient not taking: Reported on 08/20/2017 09/09/16   Mackuen, Cindee Salt, MD  Riboflavin 400 MG TABS Take 400 mg by mouth daily. Patient not taking: Reported on 03/30/2018 05/18/17   Drema Dallas, DO  SUMAtriptan (IMITREX) 20 MG/ACT nasal spray 1 spray at earliest onset of migraine.  May repeat once in 2 hours if headache persists or recurs. Patient not taking: Reported on 03/30/2018 05/18/17   Drema Dallas, DO  topiramate (TOPAMAX) 50 MG tablet Take 1 tablet (50 mg total) by mouth at bedtime. Patient not taking: Reported on 03/30/2018 08/20/17   Drema Dallas, DO    Family History Family History  Problem Relation Age of Onset  . Cancer Maternal Aunt   . Cancer Maternal Grandmother   . Cancer Maternal Grandfather   . Diabetes Other     Social History Social History   Tobacco Use  . Smoking status: Current Some Day Smoker    Packs/day: 0.50    Types: Cigarettes  . Smokeless tobacco: Never Used  Substance Use Topics  . Alcohol use: Yes    Comment: socially  . Drug use: Yes    Types: Marijuana     Allergies   Sulfur and Aspartame and phenylalanine   Review of Systems Review of Systems  Cardiovascular: Positive for chest pain.       CP and pressure  Psychiatric/Behavioral: Positive for dysphoric mood, sleep disturbance and suicidal ideas.  All other systems reviewed and are negative.    Physical Exam Updated Vital Signs LMP 03/29/2018 (Exact Date)   Physical Exam  Constitutional: She is oriented to person, place, and time. She appears well-developed and well-nourished. No distress.  Patient becomes upset throat interview, but is able to calm and focus.  In no apparent distress  HENT:  Head: Normocephalic and atraumatic.  Eyes: Pupils are equal, round, and reactive to light.  Conjunctivae and EOM are normal.  Neck: Normal range of motion. Neck supple.  Cardiovascular: Normal rate, regular rhythm and intact distal pulses.  Pulmonary/Chest: Effort normal and breath sounds normal. No respiratory distress. She has no wheezes.  Speaking full sentences.  Clear lung sounds in all fields.  Abdominal: Soft. She exhibits no distension. There is no tenderness.  Musculoskeletal: Normal range of motion.  Neurological: She is alert and oriented to person, place, and time.  Skin: Skin is warm and dry. Capillary refill takes less than 2 seconds.  Psychiatric: Her mood appears anxious. She is not actively hallucinating. She exhibits a depressed mood. She expresses no homicidal ideation. She expresses no homicidal plans.  Nursing note and vitals reviewed.     ED Treatments / Results  Labs (all labs ordered are listed, but only abnormal results are displayed) Labs Reviewed  COMPREHENSIVE METABOLIC PANEL - Abnormal; Notable for the following components:      Result Value   CO2 21 (*)    All other components within normal limits  ACETAMINOPHEN LEVEL - Abnormal; Notable for the following components:   Acetaminophen (Tylenol), Serum <10 (*)    All other components within normal limits  CBC - Abnormal; Notable for the following components:   Hemoglobin 11.1 (*)    HCT 34.2 (*)    MCH 25.8 (*)    RDW 16.3 (*)    All other components within normal limits  RAPID URINE DRUG SCREEN, HOSP PERFORMED - Abnormal; Notable for the following components:   Tetrahydrocannabinol POSITIVE (*)    All other components within normal limits  ETHANOL  SALICYLATE LEVEL  D-DIMER, QUANTITATIVE (NOT AT [facility]  I-STAT BETA HCG BLOOD, ED ([facility], [facility], [facility] ONLY)  I-STAT TROPONIN, ED    EKG None  EKG: NSR, rate of 73. Regular. No Stemi. Qt/qtc: 410/451. PR interval: 142. QRS: 94  Radiology No results found.  Procedures Procedures (including critical care time)  Medications Ordered in  ED Medications - No data to display   Initial Impression / Assessment and Plan / ED Course  I have reviewed the triage vital signs and the nursing notes.  Pertinent labs & imaging results that were available during my care of the patient were reviewed by me and considered in my medical decision making (see chart for details).     Patient presenting for evaluation of increased anxiety and depressed mood.  Having thoughts that if she was dead, she would be in pain.  No attempt at hurting herself.  Patient also reporting a chest pain pain/pressure which is new for her.  Will obtain basic labs and urine for medical clearance, chest x-ray, EKG, and d-dimer.  Labs and urine reassuring.  EKG without stemi or concerning findings. At this time, patient is medically cleared for TTS eval.  Final Clinical Impressions(s) / ED Diagnoses   Final diagnoses:  None    ED Discharge Orders    None       Alveria Apley, PA-C 03/31/18 1322    Charlynne Pander, MD 03/31/18 2312

## 2018-03-31 ENCOUNTER — Encounter (HOSPITAL_COMMUNITY): Payer: Self-pay

## 2018-03-31 ENCOUNTER — Other Ambulatory Visit: Payer: Self-pay

## 2018-03-31 ENCOUNTER — Inpatient Hospital Stay (HOSPITAL_COMMUNITY)
Admission: AD | Admit: 2018-03-31 | Discharge: 2018-04-03 | DRG: 885 | Disposition: A | Discharge status: Home / Self Care | Payer: No Typology Code available for payment source | Source: Intra-hospital | Attending: Psychiatry | Admitting: Psychiatry

## 2018-03-31 DIAGNOSIS — F322 Major depressive disorder, single episode, severe without psychotic features: Principal | ICD-10-CM | POA: Diagnosis present

## 2018-03-31 DIAGNOSIS — G4700 Insomnia, unspecified: Secondary | ICD-10-CM | POA: Diagnosis present

## 2018-03-31 DIAGNOSIS — R51 Headache: Secondary | ICD-10-CM

## 2018-03-31 DIAGNOSIS — Z882 Allergy status to sulfonamides status: Secondary | ICD-10-CM

## 2018-03-31 DIAGNOSIS — Z811 Family history of alcohol abuse and dependence: Secondary | ICD-10-CM | POA: Diagnosis not present

## 2018-03-31 DIAGNOSIS — Z86711 Personal history of pulmonary embolism: Secondary | ICD-10-CM | POA: Diagnosis not present

## 2018-03-31 DIAGNOSIS — Z915 Personal history of self-harm: Secondary | ICD-10-CM | POA: Diagnosis not present

## 2018-03-31 DIAGNOSIS — R45851 Suicidal ideations: Secondary | ICD-10-CM | POA: Diagnosis present

## 2018-03-31 DIAGNOSIS — F419 Anxiety disorder, unspecified: Secondary | ICD-10-CM | POA: Diagnosis present

## 2018-03-31 DIAGNOSIS — Z87442 Personal history of urinary calculi: Secondary | ICD-10-CM

## 2018-03-31 DIAGNOSIS — Z634 Disappearance and death of family member: Secondary | ICD-10-CM | POA: Diagnosis not present

## 2018-03-31 DIAGNOSIS — Z23 Encounter for immunization: Secondary | ICD-10-CM | POA: Diagnosis not present

## 2018-03-31 DIAGNOSIS — F17210 Nicotine dependence, cigarettes, uncomplicated: Secondary | ICD-10-CM | POA: Diagnosis not present

## 2018-03-31 DIAGNOSIS — F410 Panic disorder [episodic paroxysmal anxiety] without agoraphobia: Secondary | ICD-10-CM | POA: Diagnosis not present

## 2018-03-31 DIAGNOSIS — Z91018 Allergy to other foods: Secondary | ICD-10-CM | POA: Diagnosis not present

## 2018-03-31 DIAGNOSIS — Z818 Family history of other mental and behavioral disorders: Secondary | ICD-10-CM | POA: Diagnosis not present

## 2018-03-31 MED ORDER — LORAZEPAM 0.5 MG PO TABS
0.5000 mg | ORAL_TABLET | Freq: Four times a day (QID) | ORAL | Status: DC | PRN
  Administered 2018-03-31 – 2018-04-01 (×2): 0.5 mg via ORAL
  Filled 2018-03-31 (×2): qty 1

## 2018-03-31 MED ORDER — MIRTAZAPINE 7.5 MG PO TABS
7.5000 mg | ORAL_TABLET | Freq: Every day | ORAL | Status: DC
  Administered 2018-03-31 – 2018-04-01 (×2): 7.5 mg via ORAL
  Filled 2018-03-31 (×5): qty 1

## 2018-03-31 MED ORDER — PNEUMOCOCCAL VAC POLYVALENT 25 MCG/0.5ML IJ INJ
0.5000 mL | INJECTION | INTRAMUSCULAR | Status: AC
  Administered 2018-04-02: 0.5 mL via INTRAMUSCULAR

## 2018-03-31 MED ORDER — MAGNESIUM HYDROXIDE 400 MG/5ML PO SUSP: 30.0000 mL | Freq: Every day | ORAL | Status: DC | PRN

## 2018-03-31 MED ORDER — ALUM & MAG HYDROXIDE-SIMETH 200-200-20 MG/5ML PO SUSP: 30.0000 mL | ORAL | Status: DC | PRN

## 2018-03-31 MED ORDER — LORAZEPAM 0.5 MG PO TABS
0.5000 mg | ORAL_TABLET | Freq: Once | ORAL | Status: AC
  Administered 2018-03-31: 0.5 mg via ORAL
  Filled 2018-03-31: qty 1

## 2018-03-31 MED ORDER — DULOXETINE HCL 60 MG PO CPEP
60.0000 mg | ORAL_CAPSULE | Freq: Every day | ORAL | Status: DC
  Administered 2018-03-31 – 2018-04-03 (×4): 60 mg via ORAL
  Filled 2018-03-31 (×4): qty 1
  Filled 2018-03-31 (×2): qty 7
  Filled 2018-03-31 (×3): qty 1

## 2018-03-31 MED ORDER — ACETAMINOPHEN 325 MG PO TABS
650.0000 mg | ORAL_TABLET | Freq: Four times a day (QID) | ORAL | Status: DC | PRN
  Administered 2018-03-31: 650 mg via ORAL
  Filled 2018-03-31: qty 2

## 2018-03-31 NOTE — BH Assessment (Addendum)
Assessment Note  Jasmine Christian is an 47 y.o. female.  -Clinician reviewed note from [place], [place].  Patient states that throughout the course of her life, she is struggled with anxiety and depression.  She has multiple factors which have made her more stressed and anxious recently, including a son who was arrested for murder, 39 year old pregnant daughter, and the death of a family member last year.  Pt says that she has been having some suicidal thoughts and that is what prompted her to come in for evaluation.  Patient says that she has been having panic attacks also.    Patient relates that her 41 year old son has been charged with murder three weeks ago.  Her 42 year old daughter is pregnant.  The father of the baby is also implicated in this murder.  Patient's brother was killed in a home invasion in December 25, 2022.  Patient says that her boyfriend lives with her and her other children live with them.  She has a 42 year old son, a 65 year old daughter and 106 year old daughter.    Patient becomes tearful talking about how she feels that she has to be strong for her family but she is afraid if she is "weak" then her children will break down.  Patient says she has been having depression, some insomnia, tearfulness and guilt.  Patient has had some loss of appetite and goes through periods of not sleeping well.    Patient says that she has thoughts of "being out of my pain if I were dead."  She says that she does not have a plan to kill herself.  She has no previous suicide attempts.  Pt denies any HI or A/V hallucinations.  Patient has no previous inpatient care.  She said that she did have a prescription for cymbalta from when her brother died in 12/25/2022.  Pt had a few left from then and used some for a few days earlier in the week.  She is interested in talking with a psychiatrist about medications.  Patient says that she does not feel stable at home at this time.  -Clinician discussed  patient care with Nira Conn, FNP.  He recommends patient be observed overnight and seen by psychiatry in AM.  Diagnosis: F32.2 MDD single episode severe  Past Medical History:  Past Medical History:  Diagnosis Date  . Drug abuse (HCC)   . Ectopic pregnancy   . Kidney stones   . Migraines   . Seizure (HCC)   . Tobacco abuse   . UTI (lower urinary tract infection)     Past Surgical History:  Procedure Laterality Date  . ECTOPIC PREGNANCY SURGERY    . WISDOM TOOTH EXTRACTION      Family History:  Family History  Problem Relation Age of Onset  . Cancer Maternal Aunt   . Cancer Maternal Grandmother   . Cancer Maternal Grandfather   . Diabetes Other     Social History:  reports that she has been smoking cigarettes.  She has been smoking about 0.50 packs per day. She has never used smokeless tobacco. She reports that she drinks alcohol. She reports that she has current or past drug history. Drug: Marijuana.  Additional Social History:  Alcohol / Drug Use Pain Medications: Some muscle relaxers but does not take often.  Only if she feels she needs it. Prescriptions: Cymbalta from a previous prescription a year ago.   Otherwise no psychiatric meds. Over the Counter:  Advil as needed. History of alcohol / drug use?: Yes Substance #1 Name of Substance 1: Marijuana 1 - Age of First Use: 47 years of age 30 - Amount (size/oz): A joint or so 1 - Frequency: Less than once every two months  1 - Duration: off and on 1 - Last Use / Amount: Three weeks ago.  CIWA: CIWA-Ar BP: 117/84 Pulse Rate: 70 COWS:    Allergies:  Allergies  Allergen Reactions  . Sulfur Anaphylaxis  . Aspartame And Phenylalanine Swelling    Swelling of throat, fever (artificial sweetener)    Home Medications:  (Not in a hospital admission)  OB/GYN Status:  Patient's last menstrual period was 03/29/2018 (exact date).  General Assessment Data [facility] of Assessment: [facility] TTS Assessment: In system Is  this a Tele or Face-to-Face Assessment?: Face-to-Face Is this an Initial Assessment or a Re-assessment for this encounter?: Initial Assessment Marital status: Single Is patient pregnant?: No Pregnancy Status: No Living Arrangements: Spouse/significant other(Boyfriend lives w/ her and 3 children) Can pt return to current living arrangement?: Yes Admission Status: Voluntary Is patient capable of signing voluntary admission?: Yes Referral Source: Self/Family/Friend Insurance type: self pay     Crisis Care Plan Living Arrangements: Spouse/significant other(Boyfriend lives w/ her and 3 children) Name of Psychiatrist: None Name of Therapist: None  Education Status Is patient currently in school?: No Is the patient employed, unemployed or receiving disability?: Employed  Risk to self with the past 6 months Suicidal Ideation: No-Not Currently/Within Last 6 Months Has patient been a risk to self within the past 6 months prior to admission? : No Suicidal Intent: No Has patient had any suicidal intent within the past 6 months prior to admission? : No Is patient at risk for suicide?: No Suicidal Plan?: No Has patient had any suicidal plan within the past 6 months prior to admission? : Other (comment)(Some intrusive thoughts of killing herself.) Access to Means: No What has been your use of drugs/alcohol within the last 12 months?: THC Previous Attempts/Gestures: No How many times?: 0 Other Self Harm Risks: None Triggers for Past Attempts: None known Intentional Self Injurious Behavior: None Family Suicide History: No Recent stressful life event(s): Turmoil (Comment), Loss (Comment)(26 yr old son in jail'; 60 yr old daughter pregnant; brother) Persecutory voices/beliefs?: No Depression: Yes Depression Symptoms: Despondent, Tearfulness, Feeling worthless/self pity, Loss of interest in usual pleasures, Insomnia, Guilt, Isolating Substance abuse history and/or treatment for substance  abuse?: No Suicide prevention information given to non-admitted patients: Not applicable  Risk to Others within the past 6 months Homicidal Ideation: No Does patient have any lifetime risk of violence toward others beyond the six months prior to admission? : No Thoughts of Harm to Others: No Current Homicidal Intent: No Current Homicidal Plan: No Access to Homicidal Means: No Identified Victim: No one History of harm to others?: No Assessment of Violence: None Noted Violent Behavior Description: No one Does patient have access to weapons?: No Criminal Charges Pending?: No Does patient have a court date: No Is patient on probation?: No  Psychosis Hallucinations: None noted Delusions: None noted  Mental Status Report Appearance/Hygiene: Unremarkable, In scrubs Eye Contact: Fair Motor Activity: Freedom of movement, Unremarkable Speech: Logical/coherent Level of Consciousness: Alert Mood: Depressed, Anxious, Despair, Helpless, Sad Affect: Depressed, Anxious Anxiety Level: Severe Thought Processes: Coherent, Relevant Judgement: Unimpaired Orientation: Person, Place, Situation, Time Obsessive Compulsive Thoughts/Behaviors: None  Cognitive Functioning Concentration: Decreased Memory: Remote Intact, Recent Intact Is patient IDD: No Is patient DD?: No  Insight: Good Impulse Control: Fair Appetite: Fair Have you had any weight changes? : No Change Sleep: Decreased Total Hours of Sleep: (4-5 hours) Vegetative Symptoms: None  ADLScreening [facility] Assessment Services) Patient's cognitive ability adequate to safely complete daily activities?: Yes Patient able to express need for assistance with ADLs?: Yes Independently performs ADLs?: Yes (appropriate for developmental age)  Prior Inpatient Therapy Prior Inpatient Therapy: No  Prior Outpatient Therapy Prior Outpatient Therapy: No Does patient have an ACCT team?: No Does patient have Intensive In-House Services?  : No Does  patient have [facility] services? 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.) Does the patient have difficulty seeing, even when wearing glasses/contacts?: Yes(Has no prescription glasses.) Does the patient have difficulty concentrating, remembering, or making decisions?: Yes Patient able to express need for assistance with ADLs?: Yes Does the patient have difficulty dressing or bathing?: No Independently performs ADLs?: Yes (appropriate for developmental age) Does the patient have difficulty walking or climbing stairs?: No Weakness of Legs: None Weakness of Arms/Hands: None       Abuse/Neglect Assessment (Assessment to be complete while patient is alone) Abuse/Neglect Assessment Can Be Completed: Yes Physical Abuse: Denies Verbal Abuse: Yes, past (Comment)(Some past emotional abuse.) Sexual Abuse: Denies Exploitation of patient/patient's resources: Denies Self-Neglect: Denies     Merchant navy officer (For Healthcare) Does Patient Have a Medical Advance Directive?: No Would patient like information on creating a medical advance directive?: No - Patient declined          Disposition:  Disposition Initial Assessment Completed for this Encounter: Yes Patient referred to: Other (Comment)(To be reviewed by FNP)  On Site Evaluation by:   Reviewed with Physician:    Beatriz Stallion Ray 03/31/2018 1:37 AM

## 2018-03-31 NOTE — Progress Notes (Signed)
Patient ID: Jasmine Christian, female   DOB: Apr 08, 1971, 47 y.o.   MRN: [id number]  Pt presents to [facility] with complaints of multiple depressive symptoms and ongoing passive suicidal ideation. 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 She has no insurance but is supported financially and emotionally by her mother. Pt was started on Cymbalta in March 2018 when her brother was killed. She "took it for a couple of months and then stopped because I felt better."   Pt found out that her son has been charged with murder along with her daughters boyfriend a couple of weeks ago. Since then she has been having trouble sleeping and crying spells. Pt reports passive SI with thoughts of "driving my car into traffic" "so this pain will go away." Pt is tearful during assessment. Pt has a history of seizures and ongoing migraines. Per chart review pt also has a history of pulmonary emboli, kidney stones and drug abuse.   Consents signed, skin/belongings search completed and pt oriented to unit. Pt stable at this time. Pt given the opportunity to express concerns and ask questions. Pt given toiletries and snack. Will continue to monitor.

## 2018-03-31 NOTE — Progress Notes (Signed)
The patient verbalized in group that she experienced a very lengthy intake process but was relieved that she is back on her medication. Her goal for tomorrow is to continue to feel better than she did today.

## 2018-03-31 NOTE — Tx Team (Signed)
Initial Treatment Plan 03/31/2018 3:47 PM Jasmine Christian WUJ:[id number]    PATIENT STRESSORS: Marital or family conflict Other: Ongoing depressive symptoms, suicidal ideation   PATIENT STRENGTHS: Ability for insight Average or above average intelligence Capable of independent living Communication skills General fund of knowledge Motivation for treatment/growth Supportive family/friends   PATIENT IDENTIFIED PROBLEMS: Depression  Suicide Risk  "I want to feel better"  "my mood stabilized"  Family stress - children             DISCHARGE CRITERIA:  Improved stabilization in mood, thinking, and/or behavior Need for constant or close observation no longer present Safe-care adequate arrangements made Verbal commitment to aftercare and medication compliance  PRELIMINARY DISCHARGE PLAN: Outpatient therapy Participate in family therapy  PATIENT/FAMILY INVOLVEMENT: This treatment plan has been presented to and reviewed with the patient, Jasmine Christian . The patient and family have been given the opportunity to ask questions and make suggestions.  Aurora Mask, RN 03/31/2018, 3:47 PM

## 2018-03-31 NOTE — BH Assessment (Signed)
[facility] Assessment Progress Note  Per Juanetta Beets, DO, this pt requires psychiatric hospitalization at this time.  Berneice Heinrich, RN, [facility] has assigned pt to [facility] Rm [id number].  Pt has signed Voluntary Admission and Consent for Treatment, as well as Consent to Release Information to no one, and signed forms have been faxed to [facility].  Pt's nurse, Donnal Debar, has been notified, and agrees to send original paperwork along with pt via Juel Burrow, and to call report to [phone].  Doylene Canning, [place] [facility] Coordinator [phone]

## 2018-03-31 NOTE — ED Notes (Signed)
Medicated with Ativan 0.5 mg for anxiety

## 2018-03-31 NOTE — Plan of Care (Signed)
Pt continues to progress towards goals and d/c. RN will continue to monitor.  
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
## 2018-03-31 NOTE — BHH Suicide Risk Assessment (Signed)
[facility] Admission Suicide Risk Assessment   Nursing information obtained from:  Patient Demographic factors:  NA Current Mental Status:  Suicidal ideation indicated by patient, Suicidal ideation indicated by others, Suicide plan Loss Factors:  Loss of significant relationship, Financial problems / change in socioeconomic status Historical Factors:  NA Risk Reduction Factors:  Responsible for children under 70 years of age, Living with another person, especially a relative, Sense of responsibility to family, Positive social support  Total Time spent with patient: 45 minutes Principal Problem:  MDD, no Psychotic Features  Diagnosis:   Patient Active Problem List   Diagnosis Date Noted  . 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 PE (pulmonary embolism) [I26.99] 06/26/2015  . Pulmonary embolism ([facility]) [I26.99] 06/26/2015  . Diarrhea [R19.7] 06/26/2015  . Tobacco abuse [Z72.0]   . Drug abuse ([facility]) [F19.10]    Subjective Data:  Continued Clinical Symptoms:  Alcohol Use Disorder Identification Test Final Score (AUDIT): 5 The "Alcohol Use Disorders Identification Test", Guidelines for Use in Primary Care, Second Edition.  World Science writer [facility]). Score between 0-7:  no or low risk or alcohol related problems. Score between 8-15:  moderate risk of alcohol related problems. Score between 16-19:  high risk of alcohol related problems. Score 20 or above:  warrants further diagnostic evaluation for alcohol dependence and treatment.   CLINICAL FACTORS:  47 year old female, presents to hospital voluntarily due to worsening depression and anxiety, has been facing significant stressors ( brother murdered last year, son incarcerated /charged with murder )   Psychiatric Specialty Exam: Physical Exam  ROS  Blood pressure 112/74, pulse 85, temperature 98.2 F (36.8 C), temperature source Oral, resp. rate 16, height  (1.702 m), weight  77.1 kg (170 lb), last menstrual period 03/29/2018, SpO2 100 %.Body mass index is 26.63 kg/m.  See admit note MSE   COGNITIVE FEATURES THAT CONTRIBUTE TO RISK:  Closed-mindedness and Loss of executive function    SUICIDE RISK:   Moderate:  Frequent suicidal ideation with limited intensity, and duration, some specificity in terms of plans, no associated intent, good self-control, limited dysphoria/symptomatology, some risk factors present, and identifiable protective factors, including available and accessible social support.  PLAN OF CARE: Patient will be admitted to inpatient psychiatric unit for stabilization and safety. Will provide and encourage milieu participation. Provide medication management and maked adjustments as needed.  Will follow daily.    I certify that inpatient services furnished can reasonably be expected to improve the patient's condition.   Craige Cotta, MD 03/31/2018, 4:39 PM

## 2018-03-31 NOTE — Progress Notes (Signed)
Nursing note 7p-7a  Pt observed interacting with peers on unit this shift. Displayed a affect and mood upon interaction with this Clinical research associate. Pt denies pain ,SI/HI, also denies audio or visual hallucinations at this time.  Pt concerned and initially refused Remeron dose at 2200. Pt was concerned about the effects and wieght gain that the medicine may cause. Pt expressed increased anxiety and requested Ativan for anxiety 8/10 see MAR. RN continued to encourage pt to take medication and medication education,  Remeron 7.5mg  was administered per MD order without issue. Pt has a goal to work on at home medication  Regimen with provider to help with her anxiety and depression. She would also like to work with SW to [employer] post D/C. Pt continues to remain safe on the unit and is observed by rounding every 15 min. Pt able to contract for safety. Pt resting in bed with eyes closed. RN will continue to monitor.

## 2018-04-01 DIAGNOSIS — F17210 Nicotine dependence, cigarettes, uncomplicated: Secondary | ICD-10-CM

## 2018-04-01 LAB — TSH: TSH: 1.924 u[IU]/mL (ref 0.350–4.500)

## 2018-04-01 MED ORDER — GABAPENTIN 100 MG PO CAPS
100.0000 mg | ORAL_CAPSULE | Freq: Three times a day (TID) | ORAL | Status: DC
  Administered 2018-04-01 – 2018-04-03 (×5): 100 mg via ORAL
  Filled 2018-04-01 (×3): qty 1
  Filled 2018-04-01: qty 21
  Filled 2018-04-01 (×5): qty 1
  Filled 2018-04-01 (×5): qty 21

## 2018-04-01 NOTE — Progress Notes (Addendum)
[facility] MD Progress Note  [facility]/31/2019 3:16 PM Jasmine Christian  MRN:  [id number]   Subjective:  Patient reports that she is feeling better today and is denying any SI/HI/AVH and contracts for safety. She reports that she had some anxiety earlier and took an Ativan, but would prefer a medication to control her anxiety that is not a controlled substance. She reports mild depression at 4/10 with 10 being the worst. She denies any medication side effects.    Objective:Patient's chart and findings reviewed and discussed with treatment team.Patient presents in her room lying down, due to taking the Ativan made her sleepy. Patient is pleasant and cooperative. She has been in the day room interacting with peers and staff appropriately. Due to concern over her anxiety will start Neurontin 100 mg TID, to help patient avoid taking the Ativan.   Principal Problem: <principal problem not specified> Diagnosis:   Patient Active Problem List   Diagnosis Date Noted  . 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 PE (pulmonary embolism) [I26.99] 06/26/2015  . Pulmonary embolism (HCC) [I26.99] 06/26/2015  . Diarrhea [R19.7] 06/26/2015  . Tobacco abuse [Z72.0]   . Drug abuse (HCC) [F19.10]    Total Time spent with patient: 30 minutes  Past Psychiatric History: See H&P  Past Medical History:  Past Medical History:  Diagnosis Date  . Drug abuse (HCC)   . Ectopic pregnancy   . Kidney stones   . Migraines   . Seizure (HCC)   . Tobacco abuse   . UTI (lower urinary tract infection)     Past Surgical History:  Procedure Laterality Date  . ECTOPIC PREGNANCY SURGERY    . WISDOM TOOTH EXTRACTION     Family History:  Family History  Problem Relation Age of Onset  . Cancer Maternal Aunt   . Cancer Maternal Grandmother   . Cancer Maternal Grandfather   . Diabetes Other    Family Psychiatric  History: See H&P Social History:  Social History    Substance and Sexual Activity  Alcohol Use Yes   Comment: socially     Social History   Substance and Sexual Activity  Drug Use Yes  . Types: Marijuana    Social History   Socioeconomic History  . Marital status: Single    Spouse name: Not on file  . Number of children: 4  . Years of education: 2215  . Highest education level: Not on file  Occupational History    Employer: [employer]  Social Needs  . Financial resource strain: Not on file  . Food insecurity:    Worry: Not on file    Inability: Not on file  . Transportation needs:    Medical: Not on file    Non-medical: Not on file  Tobacco Use  . Smoking status: Current Some Day Smoker    Packs/day: 0.50    Types: Cigarettes  . Smokeless tobacco: Never Used  Substance and Sexual Activity  . Alcohol use: Yes    Comment: socially  . Drug use: Yes    Types: Marijuana  . Sexual activity: Not on file  Lifestyle  . Physical activity:    Days per week: Not on file    Minutes per session: Not on file  . Stress: Not on file  Relationships  . Social connections:    Talks on phone: Not on file    Gets together: Not on file    Attends religious service: Not on file  Active member of club or organization: Not on file    Attends meetings of clubs or organizations: Not on file    Relationship status: Not on file  Other Topics Concern  . Not on file  Social History Narrative   Lives with her 4 children in a one story home.  Works as a Engineer, structural for [employer].  Education: 3 years of college.    Additional Social History:    Pain Medications: Some muscle relaxers but does not take often.  Only if she feels she needs it. Prescriptions: Cymbalta from a previous prescription a year ago.   Otherwise no psychiatric meds. Over the Counter: Advil as needed. History of alcohol / drug use?: Yes Name of Substance 1: Marijuana 1 - Age of First Use: 47 years of age 107 - Amount (size/oz): A joint or so 1 - Frequency:  Less than once every two months  1 - Duration: off and on 1 - Last Use / Amount: Three weeks ago.                  Sleep: Good  Appetite:  Good  Current Medications: Current Facility-Administered Medications  Medication Dose Route Frequency Provider Last Rate Last Dose  . acetaminophen (TYLENOL) tablet 650 mg  650 mg Oral Q6H PRN Charm Rings, NP   650 mg at 03/31/18 1536  . alum & mag hydroxide-simeth (MAALOX/MYLANTA) 200-200-20 MG/5ML suspension 30 mL  30 mL Oral Q4H PRN Charm Rings, NP      . DULoxetine (CYMBALTA) DR capsule 60 mg  60 mg Oral Daily Charm Rings, NP   60 mg at 04/01/18 0804  . gabapentin (NEURONTIN) capsule 100 mg  100 mg Oral TID Money, Gerlene Burdock, FNP      . LORazepam (ATIVAN) tablet 0.5 mg  0.5 mg Oral Q6H PRN Dayshon Roback, Rockey Situ, MD   0.5 mg at 04/01/18 1220  . magnesium hydroxide (MILK OF MAGNESIA) suspension 30 mL  30 mL Oral Daily PRN Charm Rings, NP      . mirtazapine (REMERON) tablet 7.5 mg  7.5 mg Oral QHS Takayla Baillie, Rockey Situ, MD   7.5 mg at 03/31/18 2237  . pneumococcal 23 valent vaccine (PNU-IMMUNE) injection 0.5 mL  0.5 mL Intramuscular Tomorrow-1000 Holger Sokolowski, Rockey Situ, MD        Lab Results:  Results for orders placed or performed during the hospital encounter of 03/31/18 (from the past 48 hour(s))  TSH     Status: None   Collection Time: 04/01/18  7:48 AM  Result Value Ref Range   TSH 1.924 0.350 - 4.500 uIU/mL    Comment: Performed by a 3rd Generation assay with a functional sensitivity of <=0.01 uIU/mL. Performed at [facility], [address], [place], [place] [address]     Blood Alcohol level:  Lab Results  Component Value Date   ETH <10 03/30/2018    Metabolic Disorder Labs: No results found for: HGBA1C, MPG No results found for: PROLACTIN No results found for: CHOL, TRIG, HDL, CHOLHDL, VLDL, LDLCALC  Physical Findings: AIMS: Facial and Oral Movements Muscles of Facial Expression: None, normal Lips  and Perioral Area: None, normal Jaw: None, normal Tongue: None, normal,Extremity Movements Upper (arms, wrists, hands, fingers): None, normal Lower (legs, knees, ankles, toes): None, normal, Trunk Movements Neck, shoulders, hips: None, normal, Overall Severity Severity of abnormal movements (highest score from questions above): None, normal Incapacitation due to abnormal movements: None, normal Patient's awareness of abnormal  movements (rate only patient's report): No Awareness, Dental Status Current problems with teeth and/or dentures?: No Does patient usually wear dentures?: No  CIWA:  CIWA-Ar Total: 5 COWS:     Musculoskeletal: Strength & Muscle Tone: within normal limits Gait & Station: normal Patient leans: N/A  Psychiatric Specialty Exam: Physical Exam  Nursing note and vitals reviewed. Constitutional: She is oriented to person, place, and time. She appears well-developed and well-nourished.  Cardiovascular: Normal rate.  Respiratory: Effort normal.  Musculoskeletal: Normal range of motion.  Neurological: She is alert and oriented to person, place, and time.  Skin: Skin is warm.    Review of Systems  Constitutional: Negative.   HENT: Negative.   Eyes: Negative.   Respiratory: Negative.   Cardiovascular: Negative.   Gastrointestinal: Negative.   Genitourinary: Negative.   Musculoskeletal: Negative.   Skin: Negative.   Neurological: Negative.   Endo/Heme/Allergies: Negative.   Psychiatric/Behavioral: Negative.     Blood pressure (!) 122/92, pulse 94, temperature 98.3 F (36.8 C), temperature source Oral, resp. rate 16, height 5\' 7"  (1.702 m), weight 77.1 kg (170 lb), last menstrual period 03/29/2018, SpO2 100 %.Body mass index is 26.63 kg/m.  General Appearance: Casual  Eye Contact:  Good  Speech:  Clear and Coherent and Normal Rate  Volume:  Normal  Mood:  Euthymic  Affect:  Congruent  Thought Process:  Goal Directed and Descriptions of Associations: Intact   Orientation:  Full (Time, Place, and Person)  Thought Content:  WDL  Suicidal Thoughts:  No  Homicidal Thoughts:  No  Memory:  Immediate;   Good Recent;   Good Remote;   Good  Judgement:  Fair  Insight:  Good  Psychomotor Activity:  Normal  Concentration:  Concentration: Good and Attention Span: Good  Recall:  Good  Fund of Knowledge:  Good  Language:  Good  Akathisia:  No  Handed:  Right  AIMS (if indicated):     Assets:  Communication Skills Desire for Improvement Financial Resources/Insurance Housing Physical Health Social Support Transportation  ADL's:  Intact  Cognition:  WNL  Sleep:  Number of Hours: 5.75   Problems Addressed: MDD severe  Treatment Plan Summary: Daily contact with patient to assess and evaluate symptoms and progress in treatment, Medication management and Plan is to:  -Start Neurontin 100 mg PO TID for anxiety -Continue Remeron 7.5 mg PO QHS for mood stability and sleep -Continue Cymbalta 60 mg PO Daily for mood stability -Continue Ativan 0.5 mg PO Q6H PRN for anxiety -Encourage group therapy participation  Maryfrances Bunnell, FNP 04/01/2018, 3:16 PM   ..Marland KitchenAgree with NP Progress Note

## 2018-04-01 NOTE — Progress Notes (Signed)
D Pt is observed OOB UAL on the 400 hall today - She tolerates this well and she completes her daily assessment and on this she wrote she denied SI today and she rated her depression, hopelessness and anxeity " 3/3/2", respectively. SHe attended her treatment team and there she identified her needs as:  medication, she needs a pshychiatrist , and she said " I really need to get a support system".     A She is working on Radio producer- so she can learn to modulate her behavior.      R Safety is in place.

## 2018-04-01 NOTE — Tx Team (Signed)
Interdisciplinary Treatment and Diagnostic Plan Update  04/01/2018 Time of Session: 0930 [name] MRN: [id number]  Principal Diagnosis: <principal problem not specified>  Secondary Diagnoses: Active Problems:   Major depressive disorder, single episode, severe without psychosis ([place])   Current Medications:  Current Facility-Administered Medications  Medication Dose Route Frequency Provider Last Rate Last Dose  . acetaminophen (TYLENOL) tablet 650 mg  650 mg Oral Q6H PRN [name], NP   650 mg at 03/31/18 1536  . alum & mag hydroxide-simeth (MAALOX/MYLANTA) 200-200-20 MG/5ML suspension 30 mL  30 mL Oral Q4H PRN [name], NP      . DULoxetine (CYMBALTA) DR capsule 60 mg  60 mg Oral Daily [name], NP   60 mg at 04/01/18 0804  . LORazepam (ATIVAN) tablet 0.5 mg  0.5 mg Oral Q6H PRN [name], [name], MD   0.5 mg at 03/31/18 2118  . magnesium hydroxide (MILK OF MAGNESIA) suspension 30 mL  30 mL Oral Daily PRN [name], NP      . mirtazapine (REMERON) tablet 7.5 mg  7.5 mg Oral QHS [name], [name], MD   7.5 mg at 03/31/18 2237  . pneumococcal 23 valent vaccine (PNU-IMMUNE) injection 0.5 mL  0.5 mL Intramuscular Tomorrow-1000 [name], [name], MD       PTA Medications: Medications Prior to Admission  Medication Sig Dispense Refill Last Dose  . cephALEXin (KEFLEX) 500 MG capsule Take 1 capsule (500 mg total) by mouth 2 (two) times daily. (Patient not taking: Reported on 03/30/2018) 10 capsule 0 Completed Course at Unknown time  . Coenzyme Q-10 100 MG capsule Take 1 capsule (100 mg total) by mouth 3 (three) times daily. (Patient not taking: Reported on 08/20/2017) 90 capsule 2 Completed Course at Unknown time  . cyclobenzaprine (FLEXERIL) 10 MG tablet Take 1 tablet (10 mg total) by mouth 2 (two) times daily as needed for muscle spasms. (Patient not taking: Reported on 08/20/2017) 10 tablet 0 Completed Course at Unknown time  . DULoxetine (CYMBALTA) 30 MG  capsule Take 60 mg by mouth daily.   1 Past Week at Unknown time  . Magnesium Citrate 200 MG TABS Take 400 mg by mouth daily. (Patient not taking: Reported on 03/30/2018) 60 tablet 2 Completed Course at Unknown time  . naproxen (NAPROSYN) 500 MG tablet Take 1 tablet every 12 hours with food for 14 days. (Patient not taking: Reported on 03/30/2018) 28 tablet 0 Completed Course at Unknown time  . omeprazole (PRILOSEC) 20 MG capsule Take 1 capsule (20 mg total) by mouth daily. (Patient not taking: Reported on 08/20/2017) 14 capsule 1 Completed Course at Unknown time  . oxyCODONE-acetaminophen (PERCOCET/ROXICET) 5-325 MG tablet Take 1 tablet by mouth every 6 (six) hours as needed for severe pain. (Patient not taking: Reported on 08/20/2017) 11 tablet 0 Completed Course at Unknown time  . Riboflavin 400 MG TABS Take 400 mg by mouth daily. (Patient not taking: Reported on 03/30/2018) 30 tablet 2 Completed Course at Unknown time  . SUMAtriptan (IMITREX) 20 MG/ACT nasal spray 1 spray at earliest onset of migraine.  May repeat once in 2 hours if headache persists or recurs. (Patient not taking: Reported on 03/30/2018) 1 Inhaler 2 Completed Course at Unknown time  . topiramate (TOPAMAX) 50 MG tablet Take 1 tablet (50 mg total) by mouth at bedtime. (Patient not taking: Reported on 03/30/2018) 30 tablet 2 Completed Course at Unknown time    Patient Stressors: Marital or family conflict Other: Ongoing  depressive symptoms, suicidal ideation  Patient Strengths: Ability for insight Average or above average intelligence Capable of independent living Communication skills General fund of knowledge Motivation for treatment/growth Supportive family/friends  Treatment Modalities: Medication Management, Group therapy, Case management,  1 to 1 session with clinician, Psychoeducation, Recreational therapy.   Physician Treatment Plan for Primary Diagnosis: <principal problem not specified> Long Term Goal(s): Improvement  in symptoms so as ready for discharge Improvement in symptoms so as ready for discharge   Short Term Goals: Ability to identify changes in lifestyle to reduce recurrence of condition will improve Ability to maintain clinical measurements within normal limits will improve Ability to identify changes in lifestyle to reduce recurrence of condition will improve Ability to verbalize feelings will improve Ability to disclose and discuss suicidal ideas Ability to demonstrate self-control will improve Ability to identify and develop effective coping behaviors will improve Ability to maintain clinical measurements within normal limits will improve  Medication Management: Evaluate patient's response, side effects, and tolerance of medication regimen.  Therapeutic Interventions: 1 to 1 sessions, Unit Group sessions and Medication administration.  Evaluation of Outcomes: Not Met  Physician Treatment Plan for Secondary Diagnosis: Active Problems:   Major depressive disorder, single episode, severe without psychosis ([place])  Long Term Goal(s): Improvement in symptoms so as ready for discharge Improvement in symptoms so as ready for discharge   Short Term Goals: Ability to identify changes in lifestyle to reduce recurrence of condition will improve Ability to maintain clinical measurements within normal limits will improve Ability to identify changes in lifestyle to reduce recurrence of condition will improve Ability to verbalize feelings will improve Ability to disclose and discuss suicidal ideas Ability to demonstrate self-control will improve Ability to identify and develop effective coping behaviors will improve Ability to maintain clinical measurements within normal limits will improve     Medication Management: Evaluate patient's response, side effects, and tolerance of medication regimen.  Therapeutic Interventions: 1 to 1 sessions, Unit Group sessions and Medication  administration.  Evaluation of Outcomes: Not Met   RN Treatment Plan for Primary Diagnosis: <principal problem not specified> Long Term Goal(s): Knowledge of disease and therapeutic regimen to maintain health will improve  Short Term Goals: Ability to identify and develop effective coping behaviors will improve and Compliance with prescribed medications will improve  Medication Management: RN will administer medications as ordered by provider, will assess and evaluate patient's response and provide education to patient for prescribed medication. RN will report any adverse and/or side effects to prescribing provider.  Therapeutic Interventions: 1 on 1 counseling sessions, Psychoeducation, Medication administration, Evaluate responses to treatment, Monitor vital signs and CBGs as ordered, Perform/monitor CIWA, COWS, AIMS and Fall Risk screenings as ordered, Perform wound care treatments as ordered.  Evaluation of Outcomes: Not Met   LCSW Treatment Plan for Primary Diagnosis: <principal problem not specified> Long Term Goal(s): Safe transition to appropriate next level of care at discharge, Engage patient in therapeutic group addressing interpersonal concerns.  Short Term Goals: Engage patient in aftercare planning with referrals and resources, Increase social support and Increase skills for wellness and recovery  Therapeutic Interventions: Assess for all discharge needs, 1 to 1 time with Social worker, Explore available resources and support systems, Assess for adequacy in community support network, Educate family and significant other(s) on suicide prevention, Complete Psychosocial Assessment, Interpersonal group therapy.  Evaluation of Outcomes: Not Met   Progress in Treatment: Attending groups: No. Participating in groups: No. Taking medication as prescribed: Yes. Toleration medication: Yes.  Family/Significant other contact made: No, will contact:  when given permission Patient  understands diagnosis: Yes. Discussing patient identified problems/goals with staff: Yes. Medical problems stabilized or resolved: Yes. Denies suicidal/homicidal ideation: Yes. Issues/concerns per patient self-inventory: Yes. Other: none  New problem(s) identified: No, Describe:  none  New Short Term/Long Term Goal(s):  Patient Goals:  "to return home with supports in place: aftercare and medication"  Discharge Plan or Barriers:   Reason for Continuation of Hospitalization: Depression Medication stabilization  Estimated Length of Stay:3-5 days.  Attendees: Patient:Jasmine Christian 04/01/2018   Physician: Dr Parke Poisson, MD 04/01/2018   Nursing: Vira Browns, RN 04/01/2018   RN Care Manager: 04/01/2018   Social Worker: Lurline Idol, LCSW 04/01/2018   Recreational Therapist:  04/01/2018   Other:  04/01/2018   Other:  04/01/2018   Other: 04/01/2018        Scribe for Treatment Team: Joanne Chars, LCSW 04/01/2018 10:39 AM

## 2018-04-01 NOTE — Plan of Care (Signed)
  Problem: Education: Goal: Mental status will improve Outcome: Progressing   

## 2018-04-01 NOTE — Progress Notes (Signed)
Recreation Therapy Notes  Date: 5.31.19 Time: 0930 Location: 400 Hall Dayroom  Group Topic: Stress Management  Goal Area(s) Addresses:  Patient will verbalize importance of using healthy stress management.  Patient will identify positive emotions associated with healthy stress management.   Behavioral Response: Engaged  Intervention: Stress Management  Activity :  Progressive Muscle Relaxation.  LRT lead group in progressive muscle relaxation.  Patients were to tense each muscle one at a time and then release the tension.  Patients were to follow along as LRT lead them through the activity.    Education:  Stress Management, Discharge Planning.   Education Outcome: Acknowledges edcuation/In group clarification offered/Needs additional education  Clinical Observations/Feedback:  Pt attended and participated in group.      Caroll Rancher, LRT/CTRS         Caroll Rancher A 04/01/2018 11:16 AM

## 2018-04-01 NOTE — Plan of Care (Signed)
  Problem: Education: Goal: Mental status will improve 04/01/2018 1435 by Rutherford Guysuke, Jarell Mcewen L, RN Outcome: Not Progressing 04/01/2018 1420 by Rutherford Guysuke, Alacia Rehmann L, RN Outcome: Progressing   Problem: Education: Goal: Knowledge of [place] [facility] Education information/materials will improve Outcome: Not Progressing
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
## 2018-04-01 NOTE — Progress Notes (Signed)
Pt attended group this evening. 

## 2018-04-01 NOTE — [facility] Group Notes (Signed)
[facility] LCSW Group Therapy Note  Date/Time: 04/01/18, 1315  Type of Therapy/Topic:  Group Therapy:  Balance in Life  Participation Level:  active  Description of Group:    This group will address the concept of balance and how it feels and looks when one is unbalanced. Patients will be encouraged to process areas in their lives that are out of balance, and identify reasons for remaining unbalanced. Facilitators will guide patients utilizing problem- solving interventions to address and correct the stressor making their life unbalanced. Understanding and applying boundaries will be explored and addressed for obtaining  and maintaining a balanced life. Patients will be encouraged to explore ways to assertively make their unbalanced needs known to significant others in their lives, using other group members and facilitator for support and feedback.  Therapeutic Goals: 1. Patient will identify two or more emotions or situations they have that consume much of in their lives. 2. Patient will identify signs/triggers that life has become out of balance:  3. Patient will identify two ways to set boundaries in order to achieve balance in their lives:  4. Patient will demonstrate ability to communicate their needs through discussion and/or role plays  Summary of Patient Progress: Pt shared that mental/emotional and family are areas that are out of balance in her life.  Pt was active in groups discussion about ways to take steps to address areas of life that get out of balance.           Therapeutic Modalities:   Cognitive Behavioral Therapy Solution-Focused Therapy Assertiveness Training  Daleen SquibbGreg Liboria Putnam, [place]

## 2018-04-01 NOTE — [facility] Group Notes (Signed)
[facility] Group Notes:  (Nursing/MHT/Case Management/Adjunct)  Date:  04/01/2018  Time:  5:36 PM  Type of Therapy:  Psychoeducational Skills  Participation Level:  Active  Participation Quality:  Appropriate  Affect:  Appropriate  Cognitive:  Appropriate  Insight:  Appropriate  Engagement in Group:  Engaged  Modes of Intervention:  Problem-solving  Summary of Progress/Problems: Pt played bingo with peers.   Jasmine PunchesJane O Cuthbert Christian 04/01/2018, 5:36 PM

## 2018-04-02 MED ORDER — LORAZEPAM 0.5 MG PO TABS: 0.5000 mg | ORAL_TABLET | Freq: Four times a day (QID) | ORAL | Status: DC | PRN

## 2018-04-02 MED ORDER — TRAZODONE HCL 50 MG PO TABS
50.0000 mg | ORAL_TABLET | Freq: Every evening | ORAL | Status: DC | PRN
  Administered 2018-04-02: 50 mg via ORAL
  Filled 2018-04-02: qty 7
  Filled 2018-04-02: qty 1

## 2018-04-02 MED ORDER — MIRTAZAPINE 15 MG PO TABS
15.0000 mg | ORAL_TABLET | Freq: Every day | ORAL | Status: DC
  Administered 2018-04-02: 15 mg via ORAL
  Filled 2018-04-02 (×2): qty 7
  Filled 2018-04-02 (×2): qty 1

## 2018-04-02 NOTE — Progress Notes (Addendum)
Patient ID: Jasmine Christian, female   DOB: [birth year], 47 y.o.   MRN: [id number] Pt observed in the dayroom interacting with peers. Pt at assessment endorsed moderate anxiety and depression. Pt verbalized feeling a lot better than when she came in; "I spoke to the social worker for about an hour today' I think thing will get better." Pt denied SI/HI, pain of AVH. Medications offered as prescribed. All patient's questions and concerns addressed. Support, encouragement, and safe environment provided. Will continue to monitor for any changes. Pt was med compliant. Pt attended wrap-up group. Safety checks continue.

## 2018-04-02 NOTE — Progress Notes (Signed)
Pt attended group this evening. 

## 2018-04-02 NOTE — Progress Notes (Addendum)
[facility]  04/02/2018 8:16 AM Jasmine Christian  MRN:  [id number]   Subjective:  Patient reports that she is feeling pretty good today.  She reports having some disrupted sleep last night.  She states that she did not take her Neurontin yesterday because she was misunderstood about which medicine she was supposed to take so she refused it.  She denies any depression, anxiety or any SI/HI/AVH and contracts for safety.  She reports having a normal appetite, however, she does not eat a whole lot at home anyway.  Patient states that she is feeling much better and feels that the medications are working and she is hoping to be discharged soon.  Objective:Patient's chart and findings reviewed and discussed with treatment team. Patient presented in the day room getting a cup of coffee.  She is pleasant and cooperative.  She has been seen interacting with peers and staff appropriately.  Discussed medications and will increase her Remeron to 50 mg nightly to assist with sleep.  Patient did take her Neurontin this morning and advised her that we will monitor her through the day and if they are Neurontin assist with her anxiety and we will possibly discharge tomorrow.  Patient has made arrangements with CSW for follow-up appointments.  We will continue all other medications as prescribed.   Principal Problem: Major depressive disorder, single episode, severe without psychosis (HCC) Diagnosis:   Patient Active Problem List   Diagnosis Date Noted  . 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 PE (pulmonary embolism) [I26.99] 06/26/2015  . Pulmonary embolism (HCC) [I26.99] 06/26/2015  . Diarrhea [R19.7] 06/26/2015  . Tobacco abuse [Z72.0]   . Drug abuse (HCC) [F19.10]    Total Time spent with patient: 30 minutes  Past Psychiatric History: See H&P  Past Medical History:  Past Medical History:  Diagnosis Date  . Drug abuse (HCC)   . Ectopic  pregnancy   . Kidney stones   . Migraines   . Seizure (HCC)   . Tobacco abuse   . UTI (lower urinary tract infection)     Past Surgical History:  Procedure Laterality Date  . ECTOPIC PREGNANCY SURGERY    . WISDOM TOOTH EXTRACTION     Family History:  Family History  Problem Relation Age of Onset  . Cancer Maternal Aunt   . Cancer Maternal Grandmother   . Cancer Maternal Grandfather   . Diabetes Other    Family Psychiatric  History: See H&P Social History:  Social History   Substance and Sexual Activity  Alcohol Use Yes   Comment: socially     Social History   Substance and Sexual Activity  Drug Use Yes  . Types: Marijuana    Social History   Socioeconomic History  . Marital status: Single    Spouse name: Not on file  . Number of children: 4  . Years of education: 1815  . Highest education level: Not on file  Occupational History    Employer: [employer]  Social Needs  . Financial resource strain: Not on file  . Food insecurity:    Worry: Not on file    Inability: Not on file  . Transportation needs:    Medical: Not on file    Non-medical: Not on file  Tobacco Use  . Smoking status: Current Some Day Smoker    Packs/day: 0.50    Types: Cigarettes  . Smokeless tobacco: Never Used  Substance and Sexual Activity  .  Alcohol use: Yes    Comment: socially  . Drug use: Yes    Types: Marijuana  . Sexual activity: Not on file  Lifestyle  . Physical activity:    Days per week: Not on file    Minutes per session: Not on file  . Stress: Not on file  Relationships  . Social connections:    Talks on phone: Not on file    Gets together: Not on file    Attends religious service: Not on file    Active member of club or organization: Not on file    Attends meetings of clubs or organizations: Not on file    Relationship status: Not on file  Other Topics Concern  . Not on file  Social History Narrative   Lives with her 4 children in a one story home.  Works  as a Engineer, structural for [employer].  Education: 3 years of college.    Additional Social History:    Pain Medications: Some muscle relaxers but does not take often.  Only if she feels she needs it. Prescriptions: Cymbalta from a previous prescription a year ago.   Otherwise no psychiatric meds. Over the Counter: Advil as needed. History of alcohol / drug use?: Yes Name of Substance 1: Marijuana 1 - Age of First Use: 47 years of age 13 - Amount (size/oz): A joint or so 1 - Frequency: Less than once every two months  1 - Duration: off and on 1 - Last Use / Amount: Three weeks ago.                  Sleep: Fair  Appetite:  Good  Current Medications: Current Facility-Administered Medications  Medication Dose Route Frequency Provider Last Rate Last Dose  . acetaminophen (TYLENOL) tablet 650 mg  650 mg Oral Q6H PRN Charm Rings, NP   650 mg at 03/31/18 1536  . alum & mag hydroxide-simeth (MAALOX/MYLANTA) 200-200-20 MG/5ML suspension 30 mL  30 mL Oral Q4H PRN Charm Rings, NP      . DULoxetine (CYMBALTA) DR capsule 60 mg  60 mg Oral Daily Charm Rings, NP   60 mg at 04/02/18 0803  . gabapentin (NEURONTIN) capsule 100 mg  100 mg Oral TID Money, Gerlene Burdock, FNP   100 mg at 04/02/18 0803  . LORazepam (ATIVAN) tablet 0.5 mg  0.5 mg Oral Q6H PRN [name], Rockey Situ, MD   0.5 mg at 04/01/18 1220  . magnesium hydroxide (MILK OF MAGNESIA) suspension 30 mL  30 mL Oral Daily PRN Charm Rings, NP      . mirtazapine (REMERON) tablet 15 mg  15 mg Oral QHS Money, Gerlene Burdock, FNP      . pneumococcal 23 valent vaccine (PNU-IMMUNE) injection 0.5 mL  0.5 mL Intramuscular Tomorrow-1000 [name], Rockey Situ, MD      . traZODone (DESYREL) tablet 50 mg  50 mg Oral QHS PRN Money, Gerlene Burdock, FNP        Lab Results:  Results for orders placed or performed during the hospital encounter of 03/31/18 (from the past 48 hour(s))  TSH     Status: None   Collection Time: 04/01/18  7:48 AM  Result Value Ref  Range   TSH 1.924 0.350 - 4.500 uIU/mL    Comment: Performed by a 3rd Generation assay with a functional sensitivity of <=0.01 uIU/mL. Performed at [facility], [address] [place], [place] [address]     Blood Alcohol level:  Lab Results  Component Value Date   ETH <10 03/30/2018    Metabolic Disorder Labs: No results found for: HGBA1C, MPG No results found for: PROLACTIN No results found for: CHOL, TRIG, HDL, CHOLHDL, VLDL, LDLCALC  Physical Findings: AIMS: Facial and Oral Movements Muscles of Facial Expression: None, normal Lips and Perioral Area: None, normal Jaw: None, normal Tongue: None, normal,Extremity Movements Upper (arms, wrists, hands, fingers): None, normal Lower (legs, knees, ankles, toes): None, normal, Trunk Movements Neck, shoulders, hips: None, normal, Overall Severity Severity of abnormal movements (highest score from questions above): None, normal Incapacitation due to abnormal movements: None, normal Patient's awareness of abnormal movements (rate only patient's report): No Awareness, Dental Status Current problems with teeth and/or dentures?: No Does patient usually wear dentures?: No  CIWA:  CIWA-Ar Total: 5 COWS:     Musculoskeletal: Strength & Muscle Tone: within normal limits Gait & Station: normal Patient leans: N/A  Psychiatric Specialty Exam: Physical Exam  Nursing note and vitals reviewed. Constitutional: She is oriented to person, place, and time. She appears well-developed and well-nourished.  Cardiovascular: Normal rate.  Respiratory: Effort normal.  Musculoskeletal: Normal range of motion.  Neurological: She is alert and oriented to person, place, and time.  Skin: Skin is warm.    Review of Systems  Constitutional: Negative.   HENT: Negative.   Eyes: Negative.   Respiratory: Negative.   Cardiovascular: Negative.   Gastrointestinal: Negative.   Genitourinary: Negative.   Musculoskeletal: Negative.    Skin: Negative.   Neurological: Negative.   Endo/Heme/Allergies: Negative.   Psychiatric/Behavioral: Negative.     Blood pressure 111/90, pulse 80, temperature 97.9 F (36.6 C), temperature source Oral, resp. rate 20, height 5\' 7"  (1.702 m), weight 77.1 kg (170 lb), last menstrual period 03/29/2018, SpO2 100 %.Body mass index is 26.63 kg/m.  General Appearance: Casual  Eye Contact:  Good  Speech:  Clear and Coherent and Normal Rate  Volume:  Normal  Mood:  Euthymic  Affect:  Congruent  Thought Process:  Goal Directed and Descriptions of Associations: Intact  Orientation:  Full (Time, Place, and Person)  Thought Content:  WDL  Suicidal Thoughts:  No  Homicidal Thoughts:  No  Memory:  Immediate;   Good Recent;   Good Remote;   Good  Judgement:  Fair  Insight:  Good  Psychomotor Activity:  Normal  Concentration:  Concentration: Good and Attention Span: Good  Recall:  Good  Fund of Knowledge:  Good  Language:  Good  Akathisia:  No  Handed:  Right  AIMS (if indicated):     Assets:  Communication Skills Desire for Improvement Financial Resources/Insurance Housing Physical Health Social Support Transportation  ADL's:  Intact  Cognition:  WNL  Sleep:  Number of Hours: 5.75   Problems Addressed: MDD severe  Treatment Plan Summary: Daily contact with patient to assess and evaluate symptoms and progress in treatment, Medication management and Plan is to:  -Continue Neurontin 100 mg PO TID for anxiety -Increase Remeron 15 mg PO QHS for mood stability and sleep -Continue Cymbalta 60 mg PO Daily for mood stability -Continue Ativan 0.5 mg PO Q6H PRN for anxiety -Encourage group therapy participation -Potential discharge tomorrow  Maryfrances Bunnell, FNP 04/02/2018, 8:16 AM   ..Agree with NP Progress Note

## 2018-04-02 NOTE — Plan of Care (Signed)
Patient self inventory: Patient slept fair last night, did not take sleep medications. Appetite has been good, energy level normal, and concentration good. Patient rated depression 3 out of 10, hopelessness 2 out of 10, and anxiety 4 out of 10. Denies SI/HI/AVH. Patient is experiencing minor back pain (pain medication not requested). Patient's goal is to "plan for discharge." Patient also wrote "confused about anxiety med. Didn't take last night because was told I can't take it at home. Woke up a little after 2 AM and couldn't go back to sleep." Patient compliant with medication administration. Safety maintained with checks every 15 minutes. Will continue to monitor.  Problem: Education: Goal: Mental status will improve Outcome: Progressing  Patient told me she is progressively feeling better since being admitted to the hospital.

## 2018-04-02 NOTE — [facility] Group Notes (Signed)
LCSW Group Therapy Note  04/02/2018    9:15 - 10:15 AM               Type of Therapy and Topic:  Group Therapy: Anger Cues and Responses  Participation Level:  Active  In this group, patients learned how to recognize the physical, cognitive, emotional, and behavioral responses they have to anger-provoking situations.  They identified a recent time they became angry and how they reacted.  They analyzed how their reaction was possibly beneficial and how it was possibly unhelpful.  The group discussed anger warning signs and how to know when our anger can potentially become a problem. The group will discuss how our thoughts impact our feelings which in result affect our behaviors. Patients will learn thought replacement and explore alternative emotions in addition to feeling anger. Patients will share with the group and learn from CSW but also other patients coping skills that work for others.   Therapeutic Goals: 1. Patients will remember their last incident of anger and how they felt emotionally and physically, what their thoughts were at the time, and how they behaved. 2. Patients will identify how their behavior at that time worked for them, as well as how it worked against them. 3. Patients will explore how their body, mind and feelings play a role with anger. 4. Patients will learn that anger itself is normal and cannot be eliminated, and that healthier reactions can assist with resolving conflict rather than worsening situations. 5. Patients will learn thought replacement and discuss positive ways to cope with anger.   Summary of Patient Progress:  Patient was engaged and participated throughout the group session. Patient was insightful and able to respond appropriately and in a supportive manner to her peers. The patient shared that her most recent time of anger was a few days before coming here. Patient shared it was a normal day but a full day of things like taking her daughter to school,  getting ready, going to work, coming home and picking daughter up, cleaning and cooking dinner. Patient shared when she got home her house was a mess and her boyfriend and fiance had been home all day but were plating video games. Patient shared they came in for dinner and asked her to make them a plate and she lost it. Patient was able to admit she did not communicate with them about her frustration and let it build. However, feeling that they should know and wanting some time to herself. Patient shared anger is normal and can be positive during discussion. Patient identified positive ways to cope.   Therapeutic Modalities:   Cognitive Behavioral Therapy  Shellia CleverlyStephanie N Rissie Sculley, LCSW  04/02/2018 4:57 PM

## 2018-04-02 NOTE — Progress Notes (Signed)
Adult Psychoeducational Group Note  Date:  04/02/2018 Time:  9:10 AM  Group Topic/Focus:  Goals Group:   The focus of this group is to help patients establish daily goals to achieve during treatment and discuss how the patient can incorporate goal setting into their daily lives to aide in recovery.  Participation Level:  Active  Participation Quality:  Appropriate  Affect:  Appropriate  Cognitive:  Appropriate  Insight: Good  Engagement in Group:  Engaged  Modes of Intervention:  Discussion  Additional Comments:  Pt was very active in group today  Sonnie Pawloski Celcia 04/02/2018, 9:10 AM

## 2018-04-02 NOTE — [facility] Group Notes (Signed)
Recreation Activity   Date:  04/02/2018  Time:  6:26 PM  Type of Therapy:  BINGO : The purpose of the group is to create a forum where the patients can experience laughter in a benign Bingo game.  Participation Level:  Active  Participation Quality:  Appropriate  Affect:  Appropriate  Cognitive:  Alert  Insight:  Appropriate  Engagement in Group:  Engaged  Modes of Intervention:  Rapport Building  Summary of Progress/Problems:  Jasmine Christian, [name] [name] 04/02/2018, 6:26 PM

## 2018-04-02 NOTE — [facility] Group Notes (Signed)
Identifying Needs   Date:  04/02/2018  Time:  4:13 PM  Type of Therapy:  Nurse Education  /  The group focuses on teaching patients how to identify their needs and then how to develop the skills needed to get those needs met.  Participation Level:  Active  Participation Quality:  Appropriate  Affect:  Appropriate  Cognitive:  Alert  Insight:  Improving  Engagement in Group:  Engaged  Modes of Intervention:  Education  Summary of Progress/Problems:  Jasmine Christian 04/02/2018, 4:13 PM

## 2018-04-03 DIAGNOSIS — Z79899 Other long term (current) drug therapy: Secondary | ICD-10-CM

## 2018-04-03 MED ORDER — DULOXETINE HCL 60 MG PO CPEP: 60.0000 mg | ORAL_CAPSULE | Freq: Every day | ORAL | 0 refills | Status: DC

## 2018-04-03 MED ORDER — TRAZODONE HCL 50 MG PO TABS: 50.0000 mg | ORAL_TABLET | Freq: Every evening | ORAL | 0 refills | Status: DC | PRN

## 2018-04-03 MED ORDER — GABAPENTIN 100 MG PO CAPS: 100.0000 mg | ORAL_CAPSULE | Freq: Three times a day (TID) | ORAL | 0 refills | Status: DC

## 2018-04-03 MED ORDER — MIRTAZAPINE 15 MG PO TABS: 15.0000 mg | ORAL_TABLET | Freq: Every day | ORAL | 0 refills | Status: DC

## 2018-04-03 NOTE — Plan of Care (Signed)
She reported that [facility] [place] and is glad that she will be able to get medications and therapy to be successful when she is discharged.  We will continue to monitor the progress towards her goals.

## 2018-04-03 NOTE — Progress Notes (Signed)
D:  Jasmine Christian has been up and visible on the unit.  She interacts well with staff and peers.  She denies SI/HI or A/V hallucinations.  She attended evening wrap up group.  She reported that she was glad that she came in because she was feeling bad for a few weeks prior.  "It is nice to have things set up for when I leave, I didn't know where to go and it was all overwhelming."  She is hoping to leave soon as she feels like things are going to improve. A:  1:1 with RN for support and encouragement.  Medications as ordered.  Q 15 minute checks maintained for safety.  Encouraged participation in group and unit activities. R:  Jasmine Christian remains safe on the unit.  We will continue to monitor the progress towards her goals.

## 2018-04-03 NOTE — [facility] Group Notes (Signed)
Adult Psychoeducational Group Note  Date:  04/03/2018 Time:  9:51 AM  Group Topic/Focus:  Goals Group:   The focus of this group is to help patients establish daily goals to achieve during treatment and discuss how the patient can incorporate goal setting into their daily lives to aide in recovery.  Participation Level:  Active  Participation Quality:  Appropriate  Affect:  Appropriate  Cognitive:  Alert  Insight: Good  Engagement in Group:  Engaged  Modes of Intervention:  Discussion and Support  Additional Comments: Pt was active in group. Pt goal was to prepare for discharge and be appreciative   Ova FreshwaterChristina Frankie Zito 04/03/2018, 9:51 AM

## 2018-04-03 NOTE — B[facility] Suicide Risk Assessment (Signed)
[facility] INPATIENT:  Family/Significant Other Suicide Prevention Education  Suicide Prevention Education:  Patient Refusal for Family/Significant Other Suicide Prevention Education: The patient Jasmine Christian has refused to provide written consent for family/significant other to be provided Family/Significant Other Suicide Prevention Education during admission and/or prior to discharge.  Physician notified.  Lynnell ChadMareida J Grossman-Orr for [employer] Williams 04/03/2018, 9:02 AM

## 2018-04-03 NOTE — B[facility] Suicide Risk Assessment (Signed)
[facility] Discharge Suicide Risk Assessment   Principal Problem:Depression Discharge Diagnoses:  Patient Active Problem List   Diagnosis Date Noted  . 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 PE (pulmonary embolism) [I26.99] 06/26/2015  . Pulmonary embolism (HCC) [I26.99] 06/26/2015  . Diarrhea [R19.7] 06/26/2015  . Tobacco abuse [Z72.0]   . Drug abuse (HCC) [F19.10]     Total Time spent with patient: 30 minutes  Musculoskeletal: Strength & Muscle Tone: within normal limits Gait & Station: normal Patient leans: N/A  Psychiatric Specialty Exam: ROS denies headache, no chest pain, no shortness of breath, no nausea, no vomiting, no fever or chills   Blood pressure 106/80, pulse 99, temperature 99.1 F (37.3 C), temperature source Oral, resp. rate 18, height 5\' 7"  (1.702 m), weight 77.1 kg (170 lb), last menstrual period 03/29/2018, SpO2 100 %.Body mass index is 26.63 kg/m.  General Appearance: Well Groomed  Eye Contact::  Good  Speech:  Normal Rate409  Volume:  Normal  Mood:   Significantly improved, states " this is the best I have felt in a long while "  Affect:  Appropriate and more reactive   Thought Process:  Linear and Descriptions of Associations: Intact  Orientation:  Full (Time, Place, and Person)  Thought Content:  no hallucinations, no delusions, not internally preoccupied   Suicidal Thoughts:  No- denies any suicidal or self injurious ideations  Homicidal Thoughts:  No  Memory:  recent and remote grossly intact   Judgement:  Other:  improving  Insight:  improved   Psychomotor Activity:  Normal  Concentration:  Good  Recall:  Good  Fund of Knowledge:Good  Language: Good  Akathisia:  Negative  Handed:  Right  AIMS (if indicated):     Assets:  Communication Skills Desire for Improvement Resilience  Sleep:  Number of Hours: 6.75  Cognition: WNL  ADL's:  Intact   Mental Status Per Nursing Assessment::   On  Admission:  Suicidal ideation indicated by patient, Suicidal ideation indicated by others, Suicide plan  Demographic Factors:  47 year old , single, lives with fiance, employed, has 4 children  Loss Factors: An adult son has been charged with murder, is incarcerated , brother was murdered last year , teenaged daughter is currently pregnant   Historical Factors: No prior psychiatric admissions, no history of suicide attempts, history of cutting - last time more than 30 years ago, history of anxiety, depression.  Risk Reduction Factors:   Sense of responsibility to family, Employed, Living with another person, especially a relative, Positive social support and Positive coping skills or problem solving skills  Continued Clinical Symptoms:  Alert and attentive, well related, well groomed, mood improved, affect more reactive, fuller in range, no thought disorder, no suicidal or self injurious ideations, no homicidal or violent ideations, no psychotic symptoms, future oriented. Denies medication side effects- no side effects, which we have reviewed. Behavior on unit calm and in good control.  Cognitive Features That Contribute To Risk:  No gross cognitive deficits noted upon discharge. Is alert , attentive, and oriented x 3   Suicide Risk:  Mild:  Suicidal ideation of limited frequency, intensity, duration, and specificity.  There are no identifiable plans, no associated intent, mild dysphoria and related symptoms, good self-control (both objective and subjective assessment), few other risk factors, and identifiable protective factors, including available and accessible social support.  Follow-up Information    [facility] Follow up on 04/06/2018.  Why:  Assessment for therapy with Andrena Mewsave Traeford at 2:00PM on Wednesday, 04/06/18. Please arrive by 1:30PM for intake and bring photo ID. Thank you. Contact information: [address] [place] [address] [phone]         [facility] Follow up.   Why:  Appointment needed, social worker will make this appointment and call you on Monday at [phone] with the information. Contact information: [address] [place] [address] [phone]           Plan Of Care/Follow-up recommendations:  Activity:  as tolerated  Diet:  regular Tests:  NA Other:  See below  Patient is expressing readiness to discharge and there are no current grounds for involuntary commitment  Plans to return home Plans to follow up as above .   Craige CottaFernando A [name], MD 04/03/2018, 9:06 AM

## 2018-04-03 NOTE — Progress Notes (Signed)
  [facility]  Will you be returning to the same living situation after discharge:  Yes,  with family At discharge, do you have transportation home?: Yes,  family Do you have the ability to pay for your medications: No.  No insurance, sent to [facility] for help  Release of information consent forms completed and turned in to Medical Records by CSW.   Patient to Follow up at: Follow-up Information    [facility] Follow up on 04/06/2018.   Why:  Assessment for therapy with Andrena Mewsave Traeford at 2:00PM on Wednesday, 04/06/18. Please arrive by 1:30PM for intake and bring photo ID. Thank you. Contact information: [address] [place] [address] [phone]        [facility] Follow up.   Why:  Appointment needed, social worker will make this appointment and call you on Monday at [phone] with the information. Contact information: [address] [place] [address] [phone]           Next level of care provider [facility]:no  Safety Planning and Suicide Prevention discussed: No.  Patient refused  Have you used any form of tobacco in the last 30 days? (Cigarettes, Smokeless Tobacco, Cigars, and/or Pipes): Yes  Has patient been referred to the Quitline?: Patient refused referral  Patient has been referred for addiction treatment: N/A  Lynnell ChadMareida J Grossman-Orr, LCSW 04/03/2018, 9:00 AM

## 2018-04-03 NOTE — [facility] Group Notes (Signed)
LCSW Group Therapy Note  04/03/2018    9:30 -10:30 AM               Type of Therapy and Topic:  Group Therapy: Establishing Boundaries  Participation Level:  Active  In this group, patients learned how to define boundaries, discussed the different types or boundaries with examples.  They identified times that boundaries had been violated and how they reacted.  They analyzed how their reaction was possibly beneficial and how it was possibly unhelpful.  The group discussed how to set boundaries, respect others boundaries and communicate their boundaries. The group utilized a role play scenarios (working in groups) and discussed how each person in the scenario could have reacted differently and what boundaries they need to implement to improve their life. Patients discussed how to establish boundaries with clear consequences. Patients will explore discussion questions that address media influence and why it is hard to set boundaries.   Therapeutic Goals: 1. Patients will define boundaries and explore (physical, personal space and language boundaries)  2. Patients will remember their last incident where their boundaries were violated and how they behaved 3. Patients will practice empathy and understanding of other's boundaries and learn from others in group 4. Patients will explore how they may have crossed another person's boundaries in the past.  5. Patients will learn healthy ways to set and communicate boundaries. 6. Patients will actively engage in group activity utilizing role play   Summary of Patient Progress:  Patient was engaged and participated throughout the group session. Patient shared she can relate to one of the scenarios. Patient was insightful and respectful towards her peers. Patient reports appreciation hearing different perspectives from her peers and group leader. Patient shared her time is a boundary she plans to implement at home. Patient admits being guilty of making time for  everyone but herself. Patient shared the breakdown she had has reminder her how important it is to have time to herself and it is critical to her mental health.    Therapeutic Modalities:   Cognitive Behavioral Therapy  Shellia CleverlyStephanie N Lorynn Moeser, LCSW  04/03/2018 11:52 AM
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
## 2018-04-03 NOTE — Progress Notes (Signed)
Patient ID: Jasmine Christian, female   DOB: [birth year], 47 y.o.   MRN: [id number]  Pt discharged to lobby. Pt was stable and appreciative at that time, states "I really believe you guys saved my life." All papers and prescriptions were given and valuables returned. Verbal understanding expressed. Denies SI/HI and A/VH. Pt given opportunity to express concerns and ask questions.

## 2018-04-03 NOTE — Plan of Care (Signed)
Patient self inventory: Patient slept well last night, sleep medication helped. Energy level has been normal and concentration has been good. Patient rated depression, hopelessness, and anxiety 0, 2, 0. Patient denies physical problems. Patient's goal is "to get home and talk with staff and participate in programming." Patient is compliant with medications prescribed per provider. Safety maintained with 15 minute checks.  Problem: Activity: Goal: Sleeping patterns will improve Outcome: Completed/Met

## 2018-04-04 NOTE — Progress Notes (Signed)
CSW contacted [facility] regarding hospital discharge appt.  Due to Jasmine Christian being on vacation for the next 3 weeks, they are doing walk in appts only, 8:30am-3pm, Monday-Friday.   CSW was able to reach pt by phone and confirm this plan with her as well as her therapy appt on 04/06/18. Garner NashGregory Mazella Deen, MSW, LCSW Clinical Social Worker 04/04/2018 10:05 AM

## 2018-05-03 ENCOUNTER — Other Ambulatory Visit: Payer: Self-pay | Admitting: Psychiatry

## 2018-05-16 NOTE — Progress Notes (Signed)
CSW received another call from pt.  [phone].  [facility].  She is on her last pill.  Meds are working well and no issues.  CSW spoke with Dr Jama Flavorsobos, who called in 15 day prescription of all discharge medications, with one refill.  CSW called pt back and informed her of refill called in.  Pt reports her [facility] medication appt is 8/7.  [facility]. Garner NashGregory Veyda Kaufman, MSW, LCSW Clinical Social Worker 05/16/2018 4:25 PM

## 2018-06-03 ENCOUNTER — Other Ambulatory Visit (HOSPITAL_COMMUNITY): Payer: Self-pay | Admitting: Psychiatry

## 2018-12-31 ENCOUNTER — Other Ambulatory Visit: Payer: Self-pay

## 2018-12-31 ENCOUNTER — Emergency Department (HOSPITAL_BASED_OUTPATIENT_CLINIC_OR_DEPARTMENT_OTHER)
Admission: EM | Admit: 2018-12-31 | Discharge: 2018-12-31 | Disposition: A | Discharge status: Home / Self Care | Payer: Medicaid Other | Attending: Emergency Medicine | Admitting: Emergency Medicine

## 2018-12-31 ENCOUNTER — Encounter (HOSPITAL_BASED_OUTPATIENT_CLINIC_OR_DEPARTMENT_OTHER): Payer: Self-pay | Admitting: *Deleted

## 2018-12-31 DIAGNOSIS — F17210 Nicotine dependence, cigarettes, uncomplicated: Secondary | ICD-10-CM | POA: Insufficient documentation

## 2018-12-31 DIAGNOSIS — K0889 Other specified disorders of teeth and supporting structures: Secondary | ICD-10-CM | POA: Insufficient documentation

## 2018-12-31 DIAGNOSIS — Z79899 Other long term (current) drug therapy: Secondary | ICD-10-CM | POA: Insufficient documentation

## 2018-12-31 MED ORDER — AMOXICILLIN-POT CLAVULANATE 875-125 MG PO TABS: 1.0000 | ORAL_TABLET | Freq: Two times a day (BID) | ORAL | 0 refills | Status: AC

## 2018-12-31 NOTE — ED Triage Notes (Signed)
Pt reports tooth broken right upper 3 weeks ago. States she has been managing it with OTC meds but now she is having facial swelling and increased pain. States here for antibiotic

## 2018-12-31 NOTE — Discharge Instructions (Addendum)
You were given a prescription for antibiotics. Please take the antibiotic prescription fully.   Please follow-up with a dentist in the next 5 to 7 days for reevaluation.  If you do not have a dentist, resources were provided for dentist in the area in your discharge summary.  Please contact one of the offices that are listed and make an appointment for follow-up.  Please return to the emergency department for any new or worsening symptoms.  

## 2018-12-31 NOTE — ED Provider Notes (Signed)
[facility] EMERGENCY DEPARTMENT Provider Note   CSN: [id number] Arrival date & time: 12/31/18  1954    History   Chief Complaint Chief Complaint  Patient presents with  . Dental Pain    HPI Jasmine Christian is a 48 y.o. female.     HPI   Patient is a 48 year old female with a history of drug abuse, ectopic pregnancy, kidney stones, migraines, seizures, who presents emergency department today for evaluation of right upper dental pain that has been ongoing for about a month but seems to have worsened.  Pain is constant and severe in nature.  She has tried ibuprofen at home without significant relief.  She notes that in the morning she has some mild facial swelling that improves throughout the day.  She has had no fevers.  Pain is worse when she tries to eat on the right side of her mouth.  She does not currently have a dentist.  Past Medical History:  Diagnosis Date  . Drug abuse (HCC)   . Ectopic pregnancy   . Kidney stones   . Migraines   . Seizure (HCC)   . Tobacco abuse   . UTI (lower urinary tract infection)     Patient Active Problem List   Diagnosis Date Noted  . Major depressive disorder, single episode, severe without psychosis (HCC) 03/31/2018  . PE (pulmonary embolism) 06/26/2015  . Pulmonary embolism (HCC) 06/26/2015  . Diarrhea 06/26/2015  . Tobacco abuse   . Drug abuse [facility])     Past Surgical History:  Procedure Laterality Date  . ECTOPIC PREGNANCY SURGERY    . WISDOM TOOTH EXTRACTION       OB History    Gravida  5   Para  4   Term  4   Preterm      AB  1   Living  4     SAB      TAB      Ectopic  1   Multiple      Live Births               Home Medications    Prior to Admission medications   Medication Sig Start Date End Date Taking? Authorizing Provider  DULoxetine (CYMBALTA) 60 MG capsule Take 1 capsule (60 mg total) by mouth daily. For mood control 04/04/18  Yes Money, Gerlene Burdock, FNP  gabapentin  (NEURONTIN) 100 MG capsule Take 1 capsule (100 mg total) by mouth 3 (three) times daily. For anxiety 04/03/18  Yes Money, Gerlene Burdock, FNP  amoxicillin-clavulanate (AUGMENTIN) 875-125 MG tablet Take 1 tablet by mouth every 12 (twelve) hours for 7 days. 12/31/18 01/07/19  Aislee Landgren S, PA-C  mirtazapine (REMERON) 15 MG tablet Take 1 tablet (15 mg total) by mouth at bedtime. For mood control and sleep 04/03/18   Money, Gerlene Burdock, FNP  traZODone (DESYREL) 50 MG tablet Take 1 tablet (50 mg total) by mouth at bedtime as needed for sleep. 04/03/18   Money, Gerlene Burdock, FNP    Family History Family History  Problem Relation Age of Onset  . Cancer Maternal Aunt   . Cancer Maternal Grandmother   . Cancer Maternal Grandfather   . Diabetes Other     Social History Social History   Tobacco Use  . Smoking status: Current Some Day Smoker    Packs/day: 0.50    Types: Cigarettes  . Smokeless tobacco: Never Used  Substance Use Topics  . Alcohol use: Yes  Comment: socially  . Drug use: Not Currently    Types: Marijuana     Allergies   Sulfa antibiotics; Sulfur; and Aspartame and phenylalanine   Review of Systems Review of Systems  Constitutional: Negative for fever.  HENT: Positive for dental problem.   Neurological: Negative for headaches.    Physical Exam Updated Vital Signs BP (!) 146/88 (BP Location: Left Arm)   Pulse 81   Temp 98.2 F (36.8 C) (Oral)   Resp 20   Ht 5\' 7"  (1.702 m)   Wt 81.6 kg   LMP 12/10/2018 (Approximate)   SpO2 100%   BMI 28.19 kg/m   Physical Exam Vitals signs and nursing note reviewed.  Constitutional:      General: She is not in acute distress.    Appearance: She is well-developed.  HENT:     Head: Normocephalic and atraumatic.     Nose: Nose normal.     Mouth/Throat:     Pharynx: No oropharyngeal exudate or posterior oropharyngeal erythema.     Comments: Multiple missing teeth.  Tooth #3 is tender to percussion.  No associated gingival swelling.  No  swelling beneath the tongue or lingual tenderness.  No significant swelling to the face.  Tolerating secretions.  No trismus. Eyes:     Conjunctiva/sclera: Conjunctivae normal.  Neck:     Musculoskeletal: Neck supple.  Cardiovascular:     Rate and Rhythm: Normal rate.  Pulmonary:     Effort: Pulmonary effort is normal.  Musculoskeletal: Normal range of motion.  Skin:    General: Skin is warm and dry.  Neurological:     Mental Status: She is alert.    ED Treatments / Results  Labs (all labs ordered are listed, but only abnormal results are displayed) Labs Reviewed - No data to display  EKG None  Radiology No results found.  Procedures Procedures (including critical care time)  Medications Ordered in ED Medications - No data to display   Initial Impression / Assessment and Plan / ED Course  I have reviewed the triage vital signs and the nursing notes.  Pertinent labs & imaging results that were available during my care of the patient were reviewed by me and considered in my medical decision making (see chart for details).        Final Clinical Impressions(s) / ED Diagnoses   Final diagnoses:  Pain, dental   Patient with toothache.  No gross abscess.  Exam unconcerning for Ludwig's angina or spread of infection.  Will treat with abx and pain medicine.  Urged patient to follow-up with dentist.     ED Discharge Orders         Ordered    amoxicillin-clavulanate (AUGMENTIN) 875-125 MG tablet  Every 12 hours     12/31/18 2021           Rayne Du 12/31/18 2021    Linwood Dibbles, MD 12/31/18 2220

## 2019-08-01 ENCOUNTER — Encounter (HOSPITAL_COMMUNITY): Payer: Self-pay | Admitting: Emergency Medicine

## 2019-08-01 ENCOUNTER — Other Ambulatory Visit: Payer: Self-pay

## 2019-08-01 ENCOUNTER — Emergency Department (HOSPITAL_COMMUNITY)
Admission: EM | Admit: 2019-08-01 | Discharge: 2019-08-01 | Disposition: A | Discharge status: Home / Self Care | Payer: Medicaid Other | Attending: Emergency Medicine | Admitting: Emergency Medicine

## 2019-08-01 DIAGNOSIS — R55 Syncope and collapse: Secondary | ICD-10-CM | POA: Insufficient documentation

## 2019-08-01 DIAGNOSIS — F17210 Nicotine dependence, cigarettes, uncomplicated: Secondary | ICD-10-CM | POA: Insufficient documentation

## 2019-08-01 LAB — COMPREHENSIVE METABOLIC PANEL
ALT: 18 U/L (ref 0–44)
AST: 17 U/L (ref 15–41)
Albumin: 4.2 g/dL (ref 3.5–5.0)
Alkaline Phosphatase: 74 U/L (ref 38–126)
Anion gap: 6 (ref 5–15)
BUN: 11 mg/dL (ref 6–20)
CO2: 25 mmol/L (ref 22–32)
Calcium: 9.3 mg/dL (ref 8.9–10.3)
Chloride: 106 mmol/L (ref 98–111)
Creatinine, Ser: 0.76 mg/dL (ref 0.44–1.00)
GFR calc Af Amer: >60 mL/min (ref >60)
GFR calc non Af Amer: >60 mL/min (ref >60)
Glucose, Bld: 91 mg/dL (ref 70–99)
Potassium: 4 mmol/L (ref 3.5–5.1)
Sodium: 137 mmol/L (ref 135–145)
Total Bilirubin: 0.4 mg/dL (ref 0.3–1.2)
Total Protein: 7.5 g/dL (ref 6.5–8.1)

## 2019-08-01 LAB — CBC
HCT: 36.1 % (ref 36.0–46.0)
Hemoglobin: 11.5 g/dL — ABNORMAL LOW (ref 12.0–15.0)
MCH: 26.9 pg (ref 26.0–34.0)
MCHC: 31.9 g/dL (ref 30.0–36.0)
MCV: 84.5 fL (ref 80.0–100.0)
Platelets: 308 10*3/uL (ref 150–400)
RBC: 4.27 MIL/uL (ref 3.87–5.11)
RDW: 15.6 % — ABNORMAL HIGH (ref 11.5–15.5)
WBC: 8.9 10*3/uL (ref 4.0–10.5)
nRBC: 0 % (ref 0.0–0.2)

## 2019-08-01 LAB — CBG MONITORING, ED: Glucose-Capillary: 99 mg/dL (ref 70–99)

## 2019-08-01 MED ORDER — DIPHENHYDRAMINE HCL 50 MG/ML IJ SOLN
12.5000 mg | Freq: Once | INTRAMUSCULAR | Status: AC
  Administered 2019-08-01: 12.5 mg via INTRAVENOUS
  Filled 2019-08-01: qty 1

## 2019-08-01 MED ORDER — SODIUM CHLORIDE 0.9 % IV BOLUS
1000.0000 mL | Freq: Once | INTRAVENOUS | Status: AC
Start: 2019-08-01 — End: 2019-08-01
  Administered 2019-08-01: 1000 mL via INTRAVENOUS

## 2019-08-01 MED ORDER — PROCHLORPERAZINE EDISYLATE 10 MG/2ML IJ SOLN
10.0000 mg | Freq: Once | INTRAMUSCULAR | Status: AC
  Administered 2019-08-01: 10 mg via INTRAVENOUS
  Filled 2019-08-01: qty 2

## 2019-08-01 NOTE — Discharge Instructions (Addendum)
You were seen in the emergency department today after almost passing out.  Your work-up was overall reassuring.  Your labs did not show substantial abnormalities compared to prior blood work you have had done.  You were given a migraine cocktail with improvement.  Please be sure to stay well-hydrated and eat 3 meals per day with stacking in between as needed.  Please follow-up with primary care within 1 week for reassessment.  Return to the ER for new or worsening symptoms or any other concerns.

## 2019-08-01 NOTE — ED Provider Notes (Signed)
[facility] DEPT Provider Note   CSN: [id number] Arrival date & time: 08/01/19  1256     History   Chief Complaint Chief Complaint  Patient presents with  . Hypertension  . Hypoglycemia  . Near Syncope    HPI Jasmine Christian is a 48 y.o. female with a hx of tobacco abuse, migraines, prior PE, and drug abuse who presents to the emergency department with complaints of near syncopal episode earlier today at work.  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  She states she did not fully syncopized.  She was concerned that her blood sugar might have been low as she had not had anything to eat or drink and has had similar episodes with hypoglycemia in the past..  She states that a coworker gave her some orange juice and she decided to come to the emergency department to be evaluated.   Patient states she has been having issues of not feeling well for the past few weeks with concerns that her blood pressure has been intermittently high and that she has headaches that come and go.  She states that the headaches are typically to the frontal region, gradual onset, steady progression, she is having 1 currently it is a 7 out of 10 in severity, no alleviating or aggravating factors or specific triggers she can identify.  She does have a history of migraines.  Denies diplopia, numbness, tingling, focal weakness, vomiting, chest pain, dyspnea, full syncopal episode, or seizure activity.     HPI  Past Medical History:  Diagnosis Date  . Drug abuse ([place])   . Ectopic pregnancy   . Kidney stones   . Migraines   . Seizure ([place])   . Tobacco abuse   . UTI (lower urinary tract infection)     Patient Active Problem List   Diagnosis Date Noted  . Major depressive disorder, single episode, severe without psychosis ([place]) 03/31/2018  . PE (pulmonary embolism)  06/26/2015  . Pulmonary embolism ([place]) 06/26/2015  . Diarrhea 06/26/2015  . Tobacco abuse   . Drug abuse [facility])     Past Surgical History:  Procedure Laterality Date  . ECTOPIC PREGNANCY SURGERY    . WISDOM TOOTH EXTRACTION       OB History    Gravida  5   Para  4   Term  4   Preterm      AB  1   Living  4     SAB      TAB      Ectopic  1   Multiple      Live Births               Home Medications    Prior to Admission medications   Medication Sig Start Date End Date Taking? Authorizing Provider  DULoxetine (CYMBALTA) 60 MG capsule Take 1 capsule (60 mg total) by mouth daily. For mood control 04/04/18   Money, Lowry Ram, FNP  gabapentin (NEURONTIN) 100 MG capsule Take 1 capsule (100 mg total) by mouth 3 (three) times daily. For anxiety 04/03/18   Money, Lowry Ram, FNP  mirtazapine (REMERON) 15 MG tablet Take 1 tablet (15 mg total) by mouth at bedtime. For mood control and sleep 04/03/18   Money, Lowry Ram, FNP  traZODone (DESYREL) 50 MG tablet Take 1 tablet (50 mg total) by mouth  at bedtime as needed for sleep. 04/03/18   Money, Gerlene Burdock, FNP    Family History Family History  Problem Relation Age of Onset  . Cancer Maternal Aunt   . Cancer Maternal Grandmother   . Cancer Maternal Grandfather   . Diabetes Other     Social History Social History   Tobacco Use  . Smoking status: Current Some Day Smoker    Packs/day: 0.50    Types: Cigarettes  . Smokeless tobacco: Never Used  Substance Use Topics  . Alcohol use: Yes    Comment: socially  . Drug use: Not Currently    Types: Marijuana     Allergies   Sulfa antibiotics, Sulfur, and Aspartame and phenylalanine   Review of Systems Review of Systems  Constitutional: Negative for chills and fever.  Eyes: Negative for visual disturbance.  Respiratory: Negative for shortness of breath.   Cardiovascular: Negative for chest pain.  Gastrointestinal: Positive for nausea. Negative for abdominal pain, blood in  stool, constipation, diarrhea and vomiting.  Neurological: Positive for dizziness, light-headedness and headaches. Negative for seizures, syncope, speech difficulty, weakness and numbness.  All other systems reviewed and are negative.    Physical Exam Updated Vital Signs BP 131/85 (BP Location: Left Arm)   Pulse 71   Temp 98.2 F (36.8 C) (Oral)   Resp 14   LMP 06/28/2019   SpO2 100%   Physical Exam Vitals signs and nursing note reviewed.  Constitutional:      General: She is not in acute distress.    Appearance: She is well-developed. She is not toxic-appearing.  HENT:     Head: Normocephalic and atraumatic.     Mouth/Throat:     Comments: Uvula midline.  Eyes:     General:        Right eye: No discharge.        Left eye: No discharge.     Extraocular Movements: Extraocular movements intact.     Conjunctiva/sclera: Conjunctivae normal.     Pupils: Pupils are equal, round, and reactive to light.     Comments: No proptosis.   Neck:     Musculoskeletal: Normal range of motion and neck supple. No neck rigidity.  Cardiovascular:     Rate and Rhythm: Normal rate and regular rhythm.  Pulmonary:     Effort: Pulmonary effort is normal. No respiratory distress.     Breath sounds: Normal breath sounds. No wheezing, rhonchi or rales.  Abdominal:     General: There is no distension.     Palpations: Abdomen is soft.     Tenderness: There is no abdominal tenderness. There is no guarding or rebound.  Skin:    General: Skin is warm and dry.     Findings: No rash.  Neurological:     Mental Status: She is alert.     Comments: Alert. Clear speech. No facial droop. CNIII-XII grossly intact. Bilateral upper and lower extremities' sensation grossly intact. 5/5 symmetric strength with grip strength and with plantar and dorsi flexion bilaterally.  . Normal finger to nose bilaterally. Negative pronator drift. Negative Romberg sign. Gait is steady and intact.    Psychiatric:         Behavior: Behavior normal.    ED Treatments / Results  Labs (all labs ordered are listed, but only abnormal results are displayed) Labs Reviewed  CBC - Abnormal; Notable for the following components:      Result Value   Hemoglobin 11.5 (*)    RDW 15.6 (*)  All other components within normal limits  COMPREHENSIVE METABOLIC PANEL  CBG MONITORING, ED    EKG None  EKG Interpretation  Date/Time: 08/01/19 @ 19:39   Ventricular Rate: 60 bmp   PR Interval: 146 ms   QRS Duration: 98 ms   QT Interval: 456   QTC Calculation:  456 ms     Text Interpretation: NSR, incomplete RBBB, no STEMI    Radiology No results found.  Procedures Procedures (including critical care time)  Medications Ordered in ED Medications - No data to display   Initial Impression / Assessment and Plan / ED Course  I have reviewed the triage vital signs and the nursing notes.  Pertinent labs & imaging results that were available during my care of the patient were reviewed by me and considered in my medical decision making (see chart for details).   Patient presents to the emergency department with complaints of near syncopal episode which occurred earlier today in the setting of not have anything to eat or drink throughout the morning, she also mentions intermittent headache and concern for elevated blood pressure over the past few weeks.  She is nontoxic-appearing, no apparent distress, vitals without significant abnormality.  Patient is not overly hypertensive in the emergency department, BP normalized on my assessment.  She has a benign physical exam.  We will proceed with basic labs, EKG, cardiac monitoring, and migraine cocktail with IV fluids.   CBG: WNL CBC: Anemia similar to prior.  No leukocytosis CMP: No significant electrolyte derangement. Renal function & LFTs WNL.  EKG: Sinus rhythm, no STEMI, incomplete RBBB- no significant change from prior.   Work-up overall reassuring.  Possible near  syncope secondary to poor p.o. intake.  She is not hypoglycemic in the emergency department.  She is not overly hypertensive. Regarding her intermittent headaches- has hx of similar headaches, gradual onset with steady progression in severity- non concerning for [facility], ICH, ischemic CVA, dural venous sinus thrombosis, acute glaucoma, giant cell arteritis, mass, or meningitis. Pt is afebrile with no focal neuro deficits, dizziness, change in vision, proptosis, or nuchal rigidity. Patient treated for headache with migraine cocktail with improvement and is requesting discharge. I discussed treatment plan, need for PCP follow-up, and return precautions with the patient. Provided opportunity for questions, patient confirmed understanding and is in agreement with plan.    Final Clinical Impressions(s) / ED Diagnoses   Final diagnoses:  Near syncope    ED Discharge Orders    None       Desmond Lopeetrucelli, Ruqaya Strauss R, PA-C 08/01/19 2025    Milagros Lollykstra, Richard S, MD 08/02/19 [phone]

## 2019-08-01 NOTE — ED Triage Notes (Signed)
Pt states she feels shaky like she might have low blood sugar (hasn't eaten since yesterday)  and thought she had high blood pressure earlier but is normotensive at 123/99 now. Alert and oriented.

## 2021-04-07 ENCOUNTER — Emergency Department (HOSPITAL_BASED_OUTPATIENT_CLINIC_OR_DEPARTMENT_OTHER): Payer: 59

## 2021-04-07 ENCOUNTER — Emergency Department (HOSPITAL_BASED_OUTPATIENT_CLINIC_OR_DEPARTMENT_OTHER)
Admission: EM | Admit: 2021-04-07 | Discharge: 2021-04-07 | Disposition: A | Discharge status: Home / Self Care | Payer: 59 | Attending: Emergency Medicine | Admitting: Emergency Medicine

## 2021-04-07 ENCOUNTER — Encounter (HOSPITAL_BASED_OUTPATIENT_CLINIC_OR_DEPARTMENT_OTHER): Payer: Self-pay | Admitting: Emergency Medicine

## 2021-04-07 ENCOUNTER — Other Ambulatory Visit: Payer: Self-pay

## 2021-04-07 DIAGNOSIS — N3001 Acute cystitis with hematuria: Secondary | ICD-10-CM | POA: Insufficient documentation

## 2021-04-07 DIAGNOSIS — R1030 Lower abdominal pain, unspecified: Secondary | ICD-10-CM | POA: Diagnosis present

## 2021-04-07 DIAGNOSIS — N200 Calculus of kidney: Secondary | ICD-10-CM | POA: Diagnosis not present

## 2021-04-07 DIAGNOSIS — F17210 Nicotine dependence, cigarettes, uncomplicated: Secondary | ICD-10-CM | POA: Insufficient documentation

## 2021-04-07 LAB — URINALYSIS, ROUTINE W REFLEX MICROSCOPIC
Glucose, UA: 100 mg/dL — AB
Ketones, ur: NEGATIVE mg/dL
Nitrite: POSITIVE — AB
Protein, ur: 30 mg/dL — AB
Specific Gravity, Urine: 1.02 (ref 1.005–1.030)
pH: 5.5 (ref 5.0–8.0)

## 2021-04-07 LAB — COMPREHENSIVE METABOLIC PANEL
ALT: 22 U/L (ref 0–44)
AST: 20 U/L (ref 15–41)
Albumin: 4.1 g/dL (ref 3.5–5.0)
Alkaline Phosphatase: 60 U/L (ref 38–126)
Anion gap: 9 (ref 5–15)
BUN: 14 mg/dL (ref 6–20)
CO2: 24 mmol/L (ref 22–32)
Calcium: 9.3 mg/dL (ref 8.9–10.3)
Chloride: 105 mmol/L (ref 98–111)
Creatinine, Ser: 0.89 mg/dL (ref 0.44–1.00)
GFR, Estimated: >60 mL/min (ref >60)
Glucose, Bld: 90 mg/dL (ref 70–99)
Potassium: 4.2 mmol/L (ref 3.5–5.1)
Sodium: 138 mmol/L (ref 135–145)
Total Bilirubin: 0.3 mg/dL (ref 0.3–1.2)
Total Protein: 7.3 g/dL (ref 6.5–8.1)

## 2021-04-07 LAB — CBC
HCT: 40.6 % (ref 36.0–46.0)
Hemoglobin: 13.7 g/dL (ref 12.0–15.0)
MCH: 29.1 pg (ref 26.0–34.0)
MCHC: 33.7 g/dL (ref 30.0–36.0)
MCV: 86.2 fL (ref 80.0–100.0)
Platelets: 272 10*3/uL (ref 150–400)
RBC: 4.71 MIL/uL (ref 3.87–5.11)
RDW: 13.2 % (ref 11.5–15.5)
WBC: 9.7 10*3/uL (ref 4.0–10.5)
nRBC: 0 % (ref 0.0–0.2)

## 2021-04-07 LAB — URINALYSIS, MICROSCOPIC (REFLEX)

## 2021-04-07 LAB — PREGNANCY, URINE: Preg Test, Ur: NEGATIVE

## 2021-04-07 MED ORDER — CEPHALEXIN 250 MG PO CAPS
500.0000 mg | ORAL_CAPSULE | Freq: Once | ORAL | Status: AC
  Administered 2021-04-07: 500 mg via ORAL
  Filled 2021-04-07: qty 2

## 2021-04-07 MED ORDER — ONDANSETRON HCL 4 MG/2ML IJ SOLN
4.0000 mg | Freq: Once | INTRAMUSCULAR | Status: AC
  Administered 2021-04-07: 4 mg via INTRAVENOUS
  Filled 2021-04-07: qty 2

## 2021-04-07 MED ORDER — CEPHALEXIN 500 MG PO CAPS: 500.0000 mg | ORAL_CAPSULE | Freq: Four times a day (QID) | ORAL | 0 refills | Status: AC

## 2021-04-07 MED ORDER — KETOROLAC TROMETHAMINE 30 MG/ML IJ SOLN
30.0000 mg | Freq: Once | INTRAMUSCULAR | Status: AC
  Administered 2021-04-07: 30 mg via INTRAVENOUS
  Filled 2021-04-07: qty 1

## 2021-04-07 MED ORDER — FENTANYL CITRATE (PF) 100 MCG/2ML IJ SOLN
50.0000 ug | Freq: Once | INTRAMUSCULAR | Status: AC
  Administered 2021-04-07: 50 ug via INTRAVENOUS
  Filled 2021-04-07: qty 2

## 2021-04-07 NOTE — ED Provider Notes (Signed)
[facility] EMERGENCY DEPARTMENT Provider Note   CSN: [id number] Arrival date & time: 04/07/21  1226     History Chief Complaint  Patient presents with  . Abdominal Pain  . Dysuria    Jasmine Christian is a 50 y.o. female past ministry of drug abuse, ectopic pregnancy, kidney stones, seizures, UTI who presents for evaluation of 1 week of dysuria, urinary frequency, lower abdominal pain, nausea/vomiting.  Patient reports about a week ago, she started feeling like she was having UTIs.  She has a history of frequent UTIs and she used over-the-counter Azo for her symptoms.  She reports taking that but still having symptoms.  She started having some lower abdominal cramping, nausea/vomiting a few days later.  She has been able to tolerate some p.o.  She is now her abdominal pain is getting worse and is running from the top of her abdomen to the lower part.  He says the cramping will intermittently occur and will mostly like a contraction when she has it.  She has been having good bowel movements and normal one was yesterday.  She has been passing flatus.  She has not noted any fever at home.  She has not any hematuria.  Denies any chest pain, difficulty breathing.  The history is provided by the patient.       Past Medical History:  Diagnosis Date  . Drug abuse ([facility])   . Ectopic pregnancy   . Kidney stones   . Migraines   . Seizure ([facility])   . Tobacco abuse   . UTI (lower urinary tract infection)     Patient Active Problem List   Diagnosis Date Noted  . Major depressive disorder, single episode, severe without psychosis ([facility]) 03/31/2018  . PE (pulmonary embolism) 06/26/2015  . Pulmonary embolism ([facility]) 06/26/2015  . Diarrhea 06/26/2015  . Tobacco abuse   . Drug abuse [facility])     Past Surgical History:  Procedure Laterality Date  . ECTOPIC PREGNANCY SURGERY    . WISDOM TOOTH EXTRACTION       OB History    Gravida  5   Para  4   Term  4   Preterm      AB  1    Living  4     SAB      IAB      Ectopic  1   Multiple      Live Births              Family History  Problem Relation Age of Onset  . Cancer Maternal Aunt   . Cancer Maternal Grandmother   . Cancer Maternal Grandfather   . Diabetes Other     Social History   Tobacco Use  . Smoking status: Current Some Day Smoker    Packs/day: 0.50    Types: Cigarettes  . Smokeless tobacco: Never Used  Vaping Use  . Vaping Use: Never used  Substance Use Topics  . Alcohol use: Yes    Comment: socially  . Drug use: Not Currently    Types: Marijuana    Home Medications Prior to Admission medications   Medication Sig Start Date End Date Taking? Authorizing Provider  cephALEXin (KEFLEX) 500 MG capsule Take 1 capsule (500 mg total) by mouth 4 (four) times daily for 7 days. 04/07/21 04/14/21 Yes Maxwell Caul, PA-C  citalopram (CELEXA) 20 MG tablet Take 20 mg by mouth daily.    [provider]  DULoxetine (CYMBALTA) 60 MG  capsule Take 1 capsule (60 mg total) by mouth daily. For mood control Patient not taking: Reported on 08/01/2019 04/04/18   Money, Gerlene Burdock, FNP  gabapentin (NEURONTIN) 100 MG capsule Take 1 capsule (100 mg total) by mouth 3 (three) times daily. For anxiety Patient not taking: Reported on 08/01/2019 04/03/18   Money, Gerlene Burdock, FNP  mirtazapine (REMERON) 15 MG tablet Take 1 tablet (15 mg total) by mouth at bedtime. For mood control and sleep Patient not taking: Reported on 08/01/2019 04/03/18   Money, Gerlene Burdock, FNP  traZODone (DESYREL) 50 MG tablet Take 1 tablet (50 mg total) by mouth at bedtime as needed for sleep. Patient not taking: Reported on 08/01/2019 04/03/18   Money, Gerlene Burdock, FNP    Allergies    Elemental sulfur, Sulfa antibiotics, and Aspartame and phenylalanine  Review of Systems   Review of Systems  Constitutional: Negative for fever.  Respiratory: Negative for cough and shortness of breath.   Cardiovascular: Negative for chest pain.   Gastrointestinal: Positive for abdominal pain, nausea and vomiting. Negative for diarrhea.  Genitourinary: Positive for dysuria and frequency. Negative for hematuria.  Neurological: Negative for headaches.  All other systems reviewed and are negative.   Physical Exam Updated Vital Signs BP 120/78   Pulse 69   Temp 98.1 F (36.7 C) (Oral)   Resp 16   Ht 5\' 6"  (1.676 m)   Wt 79.4 kg   LMP 06/28/2019   SpO2 99%   BMI 28.25 kg/m   Physical Exam Vitals and nursing note reviewed.  Constitutional:      Appearance: Normal appearance. She is well-developed.  HENT:     Head: Normocephalic and atraumatic.  Eyes:     General: Lids are normal.     Conjunctiva/sclera: Conjunctivae normal.     Pupils: Pupils are equal, round, and reactive to light.  Cardiovascular:     Rate and Rhythm: Normal rate and regular rhythm.     Pulses: Normal pulses.     Heart sounds: Normal heart sounds. No murmur heard. No friction rub. No gallop.   Pulmonary:     Effort: Pulmonary effort is normal.     Breath sounds: Normal breath sounds.     Comments: Lungs clear to auscultation bilaterally.  Symmetric chest rise.  No wheezing, rales, rhonchi. Abdominal:     Palpations: Abdomen is soft. Abdomen is not rigid.     Tenderness: There is generalized abdominal tenderness. There is no guarding.     Comments: Abdomen soft, nondistended.  Generalized tenderness noted diffusely with no focal point.  No rigidity, guarding.  She states it feels sore when I tap her again.  No right-sided CVA tenderness. left CVA. No right CVA tenderness.   Musculoskeletal:        General: Normal range of motion.     Cervical back: Full passive range of motion without pain.  Skin:    General: Skin is warm and dry.     Capillary Refill: Capillary refill takes less than 2 seconds.  Neurological:     Mental Status: She is alert and oriented to person, place, and time.  Psychiatric:        Speech: Speech normal.     ED Results  / Procedures / Treatments   Labs (all labs ordered are listed, but only abnormal results are displayed) Labs Reviewed  URINALYSIS, ROUTINE W REFLEX MICROSCOPIC - Abnormal; Notable for the following components:      Result Value   Color, Urine  ORANGE (*)    APPearance HAZY (*)    Glucose, UA 100 (*)    Hgb urine dipstick TRACE (*)    Bilirubin Urine SMALL (*)    Protein, ur 30 (*)    Nitrite POSITIVE (*)    Leukocytes,Ua SMALL (*)    All other components within normal limits  URINALYSIS, MICROSCOPIC (REFLEX) - Abnormal; Notable for the following components:   Bacteria, UA FEW (*)    All other components within normal limits  URINE CULTURE  PREGNANCY, URINE  CBC  COMPREHENSIVE METABOLIC PANEL    EKG None  Radiology CT ABDOMEN PELVIS WO CONTRAST  Result Date: 04/07/2021 CLINICAL DATA:  Left flank pain, dysuria, history of kidney stones EXAM: CT ABDOMEN AND PELVIS WITHOUT CONTRAST TECHNIQUE: Multidetector CT imaging of the abdomen and pelvis was performed following the standard protocol without IV contrast. COMPARISON:  None. FINDINGS: Lower chest: Lung bases are clear. Hepatobiliary: Unenhanced liver is unremarkable. Gallbladder is unremarkable. No intrahepatic or extrahepatic ductal dilatation. Pancreas: Within normal limits. Spleen: Within normal limits. Adrenals/Urinary Tract: Adrenal glands are within normal limits. Right kidney is within normal limits. Three nonobstructing left renal calculi measuring up to 6 mm in the interpolar region (series 2/image 33). No ureteral or bladder calculi. No hydronephrosis. Bladder is within normal limits. Stomach/Bowel: Stomach is within normal limits. No evidence of bowel obstruction. Normal appendix (series 2/image 60). Left colonic diverticulosis, without evidence of diverticulitis. Vascular/Lymphatic: No evidence of abdominal aortic aneurysm. Atherosclerotic calcifications of the abdominal aorta and branch vessels. No suspicious abdominopelvic  lymphadenopathy. Reproductive: Status post hysterectomy. No adnexal masses. Other: No abdominopelvic ascites. Musculoskeletal: Visualized osseous structures are within normal limits. IMPRESSION: Three nonobstructing left renal calculi measuring up to 6 mm. No ureteral or bladder calculi. No hydronephrosis. Left colonic diverticulosis, without evidence of diverticulitis. Electronically Signed   By: Charline Bills M.D.   On: 04/07/2021 15:02    Procedures Procedures   Medications Ordered in ED Medications  ondansetron (ZOFRAN) injection 4 mg (4 mg Intravenous Given 04/07/21 1446)  fentaNYL (SUBLIMAZE) injection 50 mcg (50 mcg Intravenous Given 04/07/21 1447)  ketorolac (TORADOL) 30 MG/ML injection 30 mg (30 mg Intravenous Given 04/07/21 1612)  cephALEXin (KEFLEX) capsule 500 mg (500 mg Oral Given 04/07/21 1611)    ED Course  I have reviewed the triage vital signs and the nursing notes.  Pertinent labs & imaging results that were available during my care of the patient were reviewed by me and considered in my medical decision making (see chart for details).    MDM Rules/Calculators/A&P                          50 year old female who presents for evaluation of dysuria, increased urinary frequency, abdominal pain, nausea/vomiting x1 week.  No fevers.  History of UTIs.  On initial arrival, she is afebrile, toxic appearing.  Vital signs are stable.  On exam, she has diffuse abdominal tenderness noted.  She states it feels "sore" at the left CVA.  No right-sided CVA tenderness.  Consider UTI versus [place].  Consider kidney stone but feel that this is less likely.  Plan to check labs, urine.  UA shows trace hemoglobin, nitrites positive though she has been on Pyridium.  She has small leukocytes, bacteria, pyuria.  Culture sent. CBC shows no leukocytosis or anemia. CMP negative. Preg negative.   CT abd/pelvis shows 3 nonobstructing left renal calculi measuring up to 6 mm.  No ureteral or bladder  stones.   No hydronephrosis.  There is left colonic diverticulosis with no evidence of diverticulitis.  Discussed results with patient.  Patient does report feeling better after analgesics.  We will give her some additional medication.  At this time, she is afebrile, not having any nausea/vomiting.  Patient is appropriate and stable for outpatient management of UTI.  She is requesting urology referral for the nonobstructing stones in the left kidney.  We will give her outpatient follow-up. At this time, patient exhibits no emergent life-threatening condition that require further evaluation in ED. Patient had ample opportunity for questions and discussion. All patient's questions were answered with full understanding. Strict return precautions discussed. Patient expresses understanding and agreement to plan.   This patient was evaluated during a time of global shortage of iodinated contrast media. Based on guidance from the [employer], best practices, and local institutional approaches an alternative path for evaluating and managing the patient may have been employed in order to provide optimal care during this shortage. The current situation has been discussed with the patient.  Portions of this note were generated with Scientist, clinical (histocompatibility and immunogenetics)Dragon dictation software. Dictation errors may occur despite best attempts at proofreading.   Final Clinical Impression(s) / ED Diagnoses Final diagnoses:  Acute cystitis with hematuria  Kidney stones    Rx / DC Orders ED Discharge Orders         Ordered    cephALEXin (KEFLEX) 500 MG capsule  4 times daily        04/07/21 1624           Rosana HoesLayden, Fallan Mccarey A, PA-C 04/07/21 1955    Terrilee FilesButler, Michael C, MD 04/08/21 [id number]

## 2021-04-07 NOTE — ED Triage Notes (Signed)
Dysuria and lower abd pain. NV. Left flank pain , Hx UTIs.

## 2021-04-07 NOTE — Discharge Instructions (Addendum)
As we discussed, your CT scan looked reassuring.  He did have evidence of kidney stones in the left kidney.  As we discussed, you can follow-up with urology if you want these broken down.  We will plan to treat for urinary tract infection.  Take antibiotics as directed.  It is important you complete the antibiotics.  Return to the emergency department for any worsening abdominal pain, nausea/vomiting, fevers or any other worsening concerning symptoms.

## 2021-04-10 LAB — URINE CULTURE: Culture: 10000 — AB

## 2021-04-11 ENCOUNTER — Telehealth: Payer: Self-pay | Admitting: Emergency Medicine

## 2021-04-11 NOTE — Telephone Encounter (Signed)
Post ED Visit - Positive Culture Follow-up  Culture report reviewed by antimicrobial stewardship pharmacist: [facility] []  , Pharm.D. []  Enzo Bi, Pharm.D., BCPS AQ-ID []  , Pharm.D., BCPS []  Celedonio Miyamoto, Pharm.D., BCPS []  [place], Garvin Fila.D., BCPS, AAHIVP []  , Pharm.D., BCPS, AAHIVP []  Georgina Pillion, PharmD, BCPS []  , PharmD, BCPS []  [place], PharmD, BCPS [x]  [address], PharmD []  , PharmD, BCPS []  Estella Husk, PharmD  Pharmacy Team []  Lysle Pearl, PharmD []  , PharmD []  Phillips Climes, PharmD []  , Rph []  Agapito Games) , PharmD []  Yvetta Coder, PharmD []  , PharmD []  Mervyn Gay, PharmD []  , PharmD []  Vinnie Level, PharmD []  [facility], PharmD []  , PharmD []  Len Childs, PharmD   Positive urine culture Treated with Cephaexin, organism sensitive to the same and no further patient follow-up is required at this time.  Stela Iwasaki 04/11/2021, 11:02 AM

## 2021-04-16 ENCOUNTER — Ambulatory Visit
Admission: RE | Admit: 2021-04-16 | Discharge: 2021-04-16 | Disposition: A | Discharge status: Home / Self Care | Payer: 59 | Source: Ambulatory Visit | Attending: Obstetrics and Gynecology | Admitting: Obstetrics and Gynecology

## 2021-04-16 ENCOUNTER — Other Ambulatory Visit: Payer: Self-pay

## 2021-04-16 ENCOUNTER — Other Ambulatory Visit: Payer: Self-pay | Admitting: Obstetrics and Gynecology

## 2021-04-16 DIAGNOSIS — Z1231 Encounter for screening mammogram for malignant neoplasm of breast: Secondary | ICD-10-CM

## 2022-01-24 ENCOUNTER — Encounter (HOSPITAL_BASED_OUTPATIENT_CLINIC_OR_DEPARTMENT_OTHER): Payer: Self-pay | Admitting: Emergency Medicine

## 2022-01-24 ENCOUNTER — Emergency Department (HOSPITAL_BASED_OUTPATIENT_CLINIC_OR_DEPARTMENT_OTHER)
Admission: EM | Admit: 2022-01-24 | Discharge: 2022-01-24 | Disposition: A | Discharge status: Home / Self Care | Payer: 59 | Attending: Emergency Medicine | Admitting: Emergency Medicine

## 2022-01-24 ENCOUNTER — Other Ambulatory Visit: Payer: Self-pay

## 2022-01-24 DIAGNOSIS — M5441 Lumbago with sciatica, right side: Secondary | ICD-10-CM | POA: Diagnosis not present

## 2022-01-24 DIAGNOSIS — Y92410 Unspecified street and highway as the place of occurrence of the external cause: Secondary | ICD-10-CM | POA: Insufficient documentation

## 2022-01-24 DIAGNOSIS — S3992XA Unspecified injury of lower back, initial encounter: Secondary | ICD-10-CM | POA: Insufficient documentation

## 2022-01-24 DIAGNOSIS — S199XXA Unspecified injury of neck, initial encounter: Secondary | ICD-10-CM | POA: Insufficient documentation

## 2022-01-24 DIAGNOSIS — M542 Cervicalgia: Secondary | ICD-10-CM

## 2022-01-24 DIAGNOSIS — M5442 Lumbago with sciatica, left side: Secondary | ICD-10-CM | POA: Insufficient documentation

## 2022-01-24 MED ORDER — NAPROXEN 250 MG PO TABS
500.0000 mg | ORAL_TABLET | Freq: Once | ORAL | Status: AC
  Administered 2022-01-24: 500 mg via ORAL
  Filled 2022-01-24: qty 2

## 2022-01-24 MED ORDER — METHOCARBAMOL 500 MG PO TABS: 500.0000 mg | ORAL_TABLET | Freq: Two times a day (BID) | ORAL | 0 refills | Status: AC

## 2022-01-24 MED ORDER — METHOCARBAMOL 500 MG PO TABS
500.0000 mg | ORAL_TABLET | Freq: Once | ORAL | Status: AC
  Administered 2022-01-24: 500 mg via ORAL
  Filled 2022-01-24: qty 1

## 2022-01-24 MED ORDER — NAPROXEN 500 MG PO TABS: 500.0000 mg | ORAL_TABLET | Freq: Two times a day (BID) | ORAL | 0 refills | Status: DC

## 2022-01-24 MED ORDER — OXYCODONE-ACETAMINOPHEN 5-325 MG PO TABS
1.0000 | ORAL_TABLET | Freq: Once | ORAL | Status: AC
  Administered 2022-01-24: 1 via ORAL
  Filled 2022-01-24: qty 1
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
## 2022-04-02 IMAGING — MG MM DIGITAL SCREENING BILAT W/ TOMO AND CAD
8 series · 8 of 24 positions shown · non-contrast
Comparison: Previous exam(s).

CLINICAL DATA: Screening.

EXAM:
DIGITAL SCREENING BILATERAL MAMMOGRAM WITH TOMOSYNTHESIS AND CAD
TECHNIQUE: Bilateral screening digital craniocaudal and mediolateral oblique
mammograms were obtained. Bilateral screening digital breast
tomosynthesis was performed. The images were evaluated with
computer-aided detection.

[R CC synth-2D]
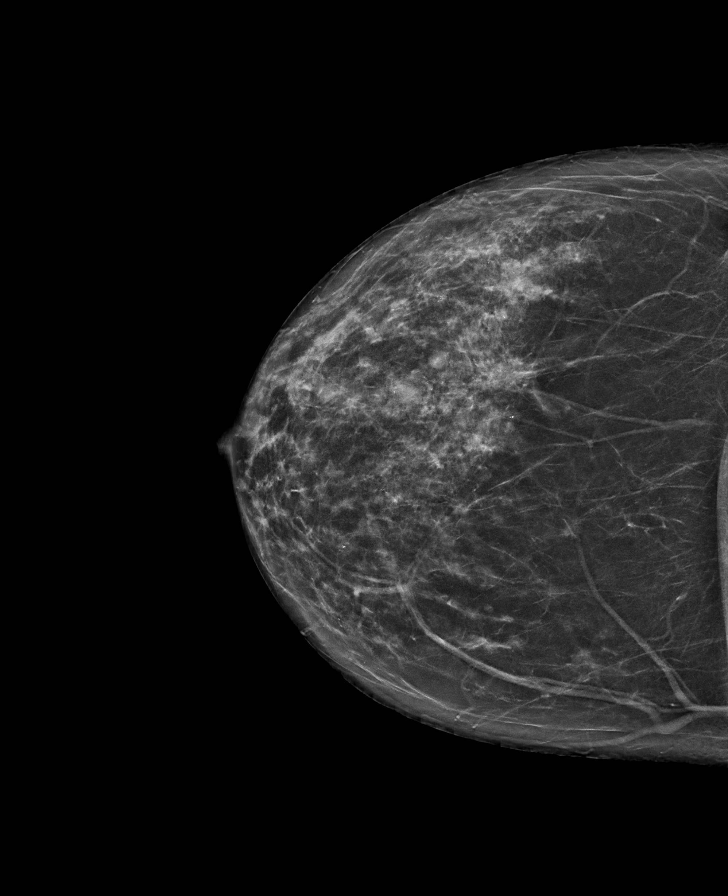

[R MLO synth-2D]
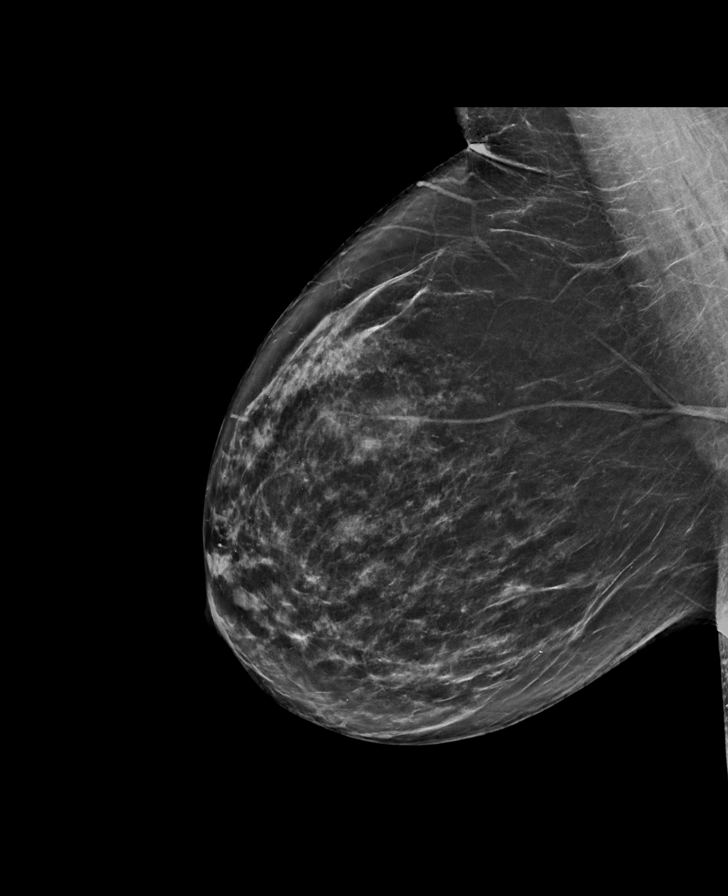

[L CC synth-2D]
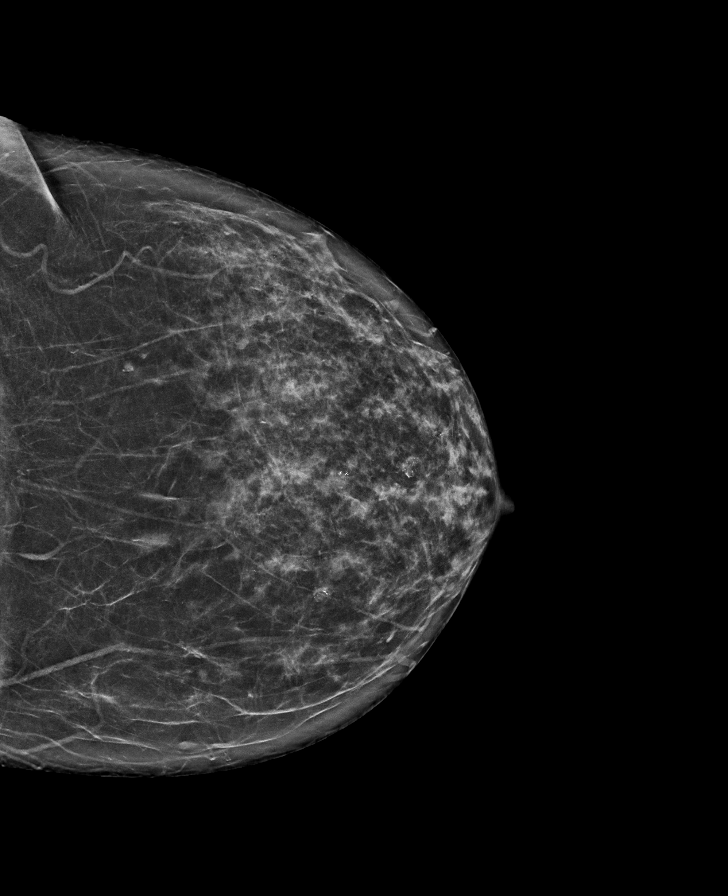

[L MLO synth-2D]
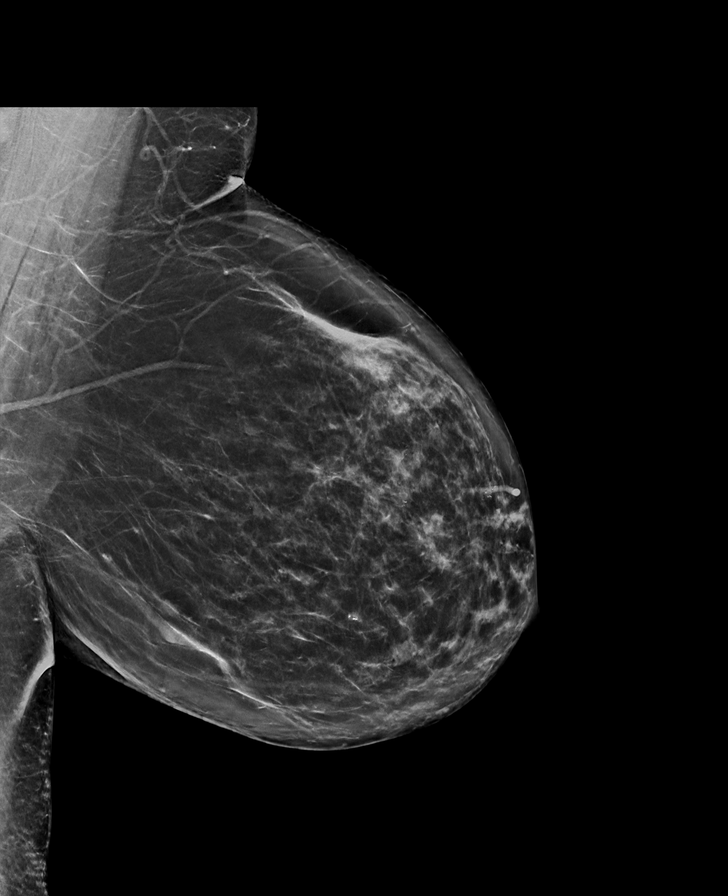

[R CC tomo · tomo slice 36/71.0]
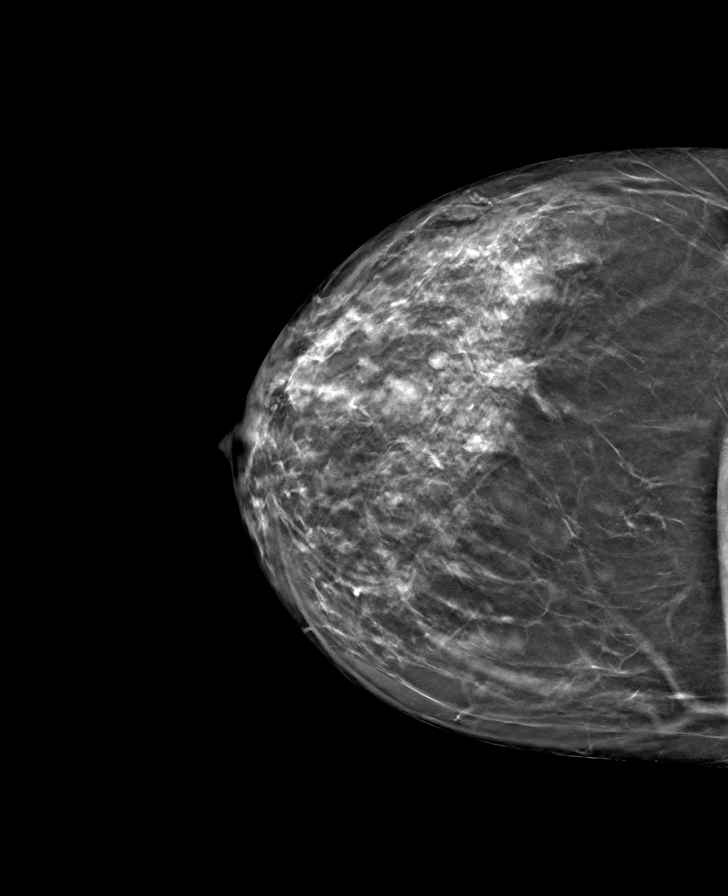

[R MLO tomo · tomo slice 41/80.0]
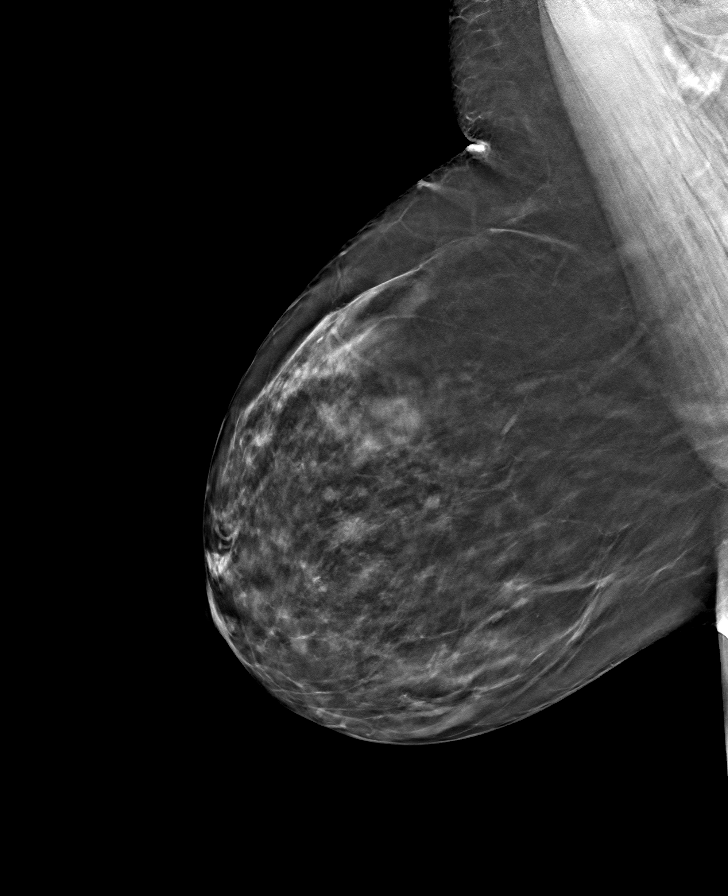

[L MLO tomo · tomo slice 41/82.0]
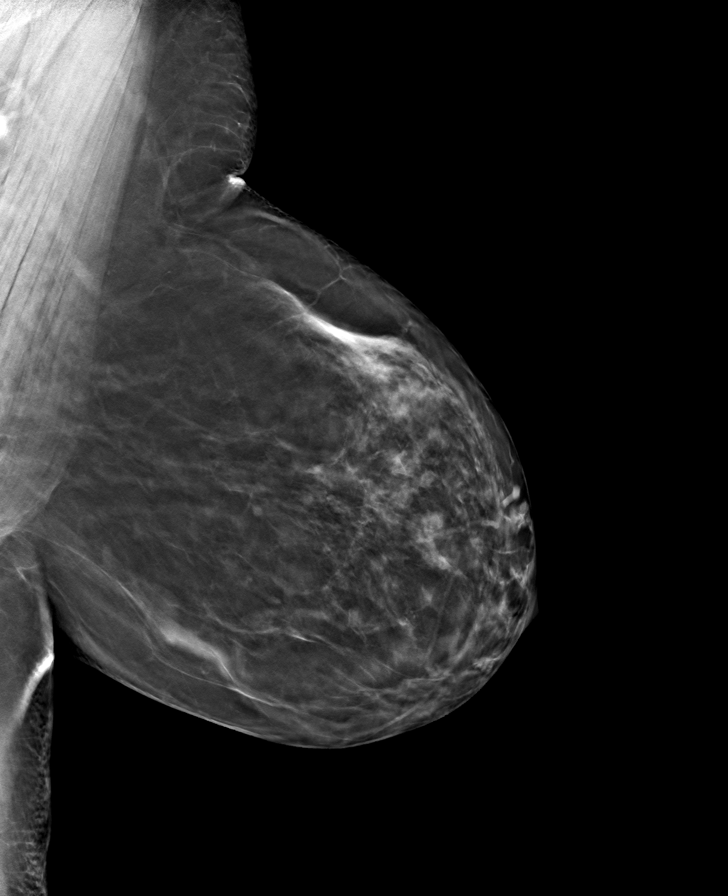

[L CC tomo · tomo slice 35/69.0]
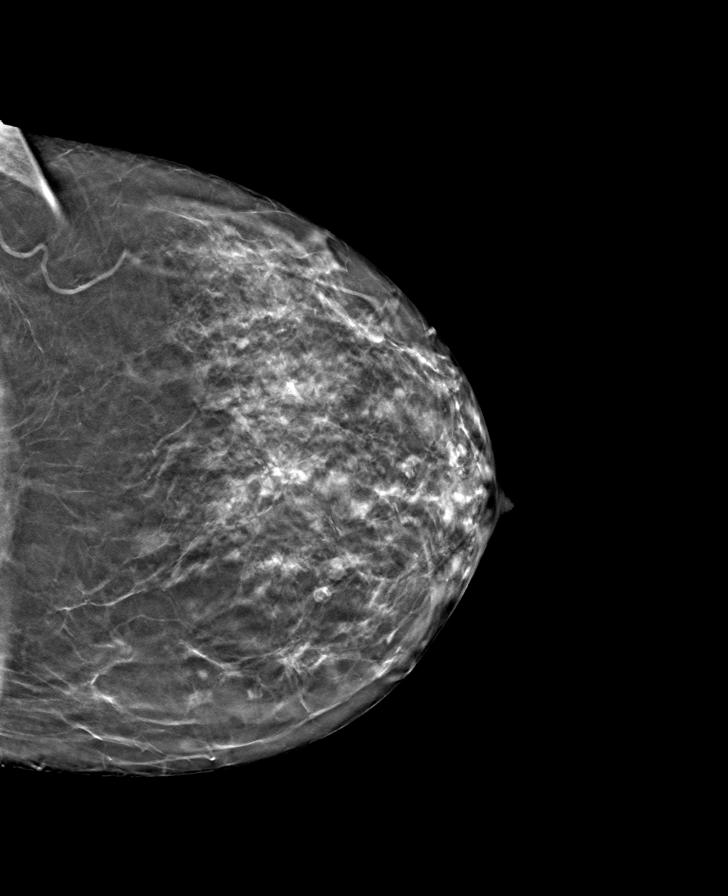

[8 of 24 positions shown; findings below may reference images not displayed]

ACR Breast Density Category c: The breast tissue is heterogeneously
dense, which may obscure small masses.
FINDINGS: There are no findings suspicious for malignancy.
IMPRESSION: No mammographic evidence of malignancy. A result letter of this
screening mammogram will be mailed directly to the patient.

RECOMMENDATION:
Screening mammogram in one year. (Code:Q3-W-BC3)

BI-RADS CATEGORY  1: Negative.

## 2022-07-04 ENCOUNTER — Emergency Department (HOSPITAL_BASED_OUTPATIENT_CLINIC_OR_DEPARTMENT_OTHER)
Admission: EM | Admit: 2022-07-04 | Discharge: 2022-07-04 | Disposition: A | Discharge status: Home / Self Care | Payer: 59 | Attending: Emergency Medicine | Admitting: Emergency Medicine

## 2022-07-04 ENCOUNTER — Emergency Department (HOSPITAL_BASED_OUTPATIENT_CLINIC_OR_DEPARTMENT_OTHER): Payer: 59

## 2022-07-04 ENCOUNTER — Encounter (HOSPITAL_BASED_OUTPATIENT_CLINIC_OR_DEPARTMENT_OTHER): Payer: Self-pay | Admitting: Emergency Medicine

## 2022-07-04 ENCOUNTER — Other Ambulatory Visit: Payer: Self-pay

## 2022-07-04 DIAGNOSIS — N1330 Unspecified hydronephrosis: Secondary | ICD-10-CM

## 2022-07-04 DIAGNOSIS — D72829 Elevated white blood cell count, unspecified: Secondary | ICD-10-CM | POA: Insufficient documentation

## 2022-07-04 DIAGNOSIS — R1032 Left lower quadrant pain: Secondary | ICD-10-CM

## 2022-07-04 DIAGNOSIS — N132 Hydronephrosis with renal and ureteral calculous obstruction: Secondary | ICD-10-CM | POA: Insufficient documentation

## 2022-07-04 DIAGNOSIS — N200 Calculus of kidney: Secondary | ICD-10-CM

## 2022-07-04 DIAGNOSIS — K5730 Diverticulosis of large intestine without perforation or abscess without bleeding: Secondary | ICD-10-CM | POA: Insufficient documentation

## 2022-07-04 LAB — CBC WITH DIFFERENTIAL/PLATELET
Abs Immature Granulocytes: 0.06 10*3/uL (ref 0.00–0.07)
Basophils Absolute: 0 10*3/uL (ref 0.0–0.1)
Basophils Relative: 0 %
Eosinophils Absolute: 0 10*3/uL (ref 0.0–0.5)
Eosinophils Relative: 0 %
HCT: 43.8 % (ref 36.0–46.0)
Hemoglobin: 14.9 g/dL (ref 12.0–15.0)
Immature Granulocytes: 0 %
Lymphocytes Relative: 12 %
Lymphs Abs: 1.6 10*3/uL (ref 0.7–4.0)
MCH: 28.9 pg (ref 26.0–34.0)
MCHC: 34 g/dL (ref 30.0–36.0)
MCV: 84.9 fL (ref 80.0–100.0)
Monocytes Absolute: 0.5 10*3/uL (ref 0.1–1.0)
Monocytes Relative: 4 %
Neutro Abs: 11.2 10*3/uL — ABNORMAL HIGH (ref 1.7–7.7)
Neutrophils Relative %: 84 %
Platelets: 328 10*3/uL (ref 150–400)
RBC: 5.16 MIL/uL — ABNORMAL HIGH (ref 3.87–5.11)
RDW: 13.5 % (ref 11.5–15.5)
WBC: 13.4 10*3/uL — ABNORMAL HIGH (ref 4.0–10.5)
nRBC: 0 % (ref 0.0–0.2)

## 2022-07-04 LAB — COMPREHENSIVE METABOLIC PANEL
ALT: 25 U/L (ref 0–44)
AST: 19 U/L (ref 15–41)
Albumin: 4.3 g/dL (ref 3.5–5.0)
Alkaline Phosphatase: 66 U/L (ref 38–126)
Anion gap: 7 (ref 5–15)
BUN: 12 mg/dL (ref 6–20)
CO2: 22 mmol/L (ref 22–32)
Calcium: 9.2 mg/dL (ref 8.9–10.3)
Chloride: 107 mmol/L (ref 98–111)
Creatinine, Ser: 0.87 mg/dL (ref 0.44–1.00)
GFR, Estimated: >60 mL/min (ref >60)
Glucose, Bld: 120 mg/dL — ABNORMAL HIGH (ref 70–99)
Potassium: 4 mmol/L (ref 3.5–5.1)
Sodium: 136 mmol/L (ref 135–145)
Total Bilirubin: 0.6 mg/dL (ref 0.3–1.2)
Total Protein: 7.8 g/dL (ref 6.5–8.1)

## 2022-07-04 LAB — URINALYSIS, ROUTINE W REFLEX MICROSCOPIC
Bilirubin Urine: NEGATIVE
Glucose, UA: NEGATIVE mg/dL
Ketones, ur: NEGATIVE mg/dL
Nitrite: NEGATIVE
Protein, ur: 100 mg/dL — AB
Specific Gravity, Urine: 1.02 (ref 1.005–1.030)
pH: 7.5 (ref 5.0–8.0)

## 2022-07-04 LAB — CBC
HCT: 43.5 % (ref 36.0–46.0)
Hemoglobin: 14.6 g/dL (ref 12.0–15.0)
MCH: 28.3 pg (ref 26.0–34.0)
MCHC: 33.6 g/dL (ref 30.0–36.0)
MCV: 84.5 fL (ref 80.0–100.0)
Platelets: 315 10*3/uL (ref 150–400)
RBC: 5.15 MIL/uL — ABNORMAL HIGH (ref 3.87–5.11)
RDW: 13.3 % (ref 11.5–15.5)
WBC: 13.6 10*3/uL — ABNORMAL HIGH (ref 4.0–10.5)
nRBC: 0 % (ref 0.0–0.2)

## 2022-07-04 LAB — URINALYSIS, MICROSCOPIC (REFLEX)

## 2022-07-04 LAB — LIPASE, BLOOD: Lipase: 40 U/L (ref 11–51)

## 2022-07-04 MED ORDER — SODIUM CHLORIDE 0.9 % IV SOLN: INTRAVENOUS | Status: DC | PRN

## 2022-07-04 MED ORDER — ONDANSETRON HCL 4 MG/2ML IJ SOLN
4.0000 mg | Freq: Once | INTRAMUSCULAR | Status: AC
  Administered 2022-07-04: 4 mg via INTRAVENOUS
  Filled 2022-07-04: qty 2

## 2022-07-04 MED ORDER — MORPHINE SULFATE (PF) 4 MG/ML IV SOLN
4.0000 mg | Freq: Once | INTRAVENOUS | Status: AC
  Administered 2022-07-04: 4 mg via INTRAVENOUS
  Filled 2022-07-04: qty 1

## 2022-07-04 MED ORDER — ONDANSETRON HCL 4 MG PO TABS: 4.0000 mg | ORAL_TABLET | Freq: Four times a day (QID) | ORAL | 0 refills | Status: DC

## 2022-07-04 MED ORDER — SODIUM CHLORIDE 0.9 % IV BOLUS
1000.0000 mL | Freq: Once | INTRAVENOUS | Status: AC
  Administered 2022-07-04: 1000 mL via INTRAVENOUS

## 2022-07-04 MED ORDER — KETOROLAC TROMETHAMINE 30 MG/ML IJ SOLN
30.0000 mg | Freq: Once | INTRAMUSCULAR | Status: AC
  Administered 2022-07-04: 30 mg via INTRAVENOUS
  Filled 2022-07-04: qty 1

## 2022-07-04 MED ORDER — SODIUM CHLORIDE 0.9 % IV SOLN
1.0000 g | Freq: Once | INTRAVENOUS | Status: AC
  Administered 2022-07-04: 1 g via INTRAVENOUS
  Filled 2022-07-04: qty 10

## 2022-07-04 MED ORDER — CEPHALEXIN 500 MG PO CAPS: 500.0000 mg | ORAL_CAPSULE | Freq: Three times a day (TID) | ORAL | 0 refills | Status: AC

## 2022-07-04 MED ORDER — IOHEXOL 300 MG/ML  SOLN
100.0000 mL | Freq: Once | INTRAMUSCULAR | Status: AC | PRN
  Administered 2022-07-04: 100 mL via INTRAVENOUS

## 2022-07-04 MED ORDER — NAPROXEN 500 MG PO TABS: 500.0000 mg | ORAL_TABLET | Freq: Two times a day (BID) | ORAL | 0 refills | Status: AC

## 2022-07-04 NOTE — ED Provider Notes (Signed)
[facility] EMERGENCY DEPARTMENT Provider Note   CSN: [id number] Arrival date & time: 07/04/22  1132     History  Chief Complaint  Patient presents with   Abdominal Pain    Jasmine Christian is a 51 y.o. female with history of chronic tobacco use, MDD, migraines, and prior PE.  Presenting today with new worsening LLQ abdominal pain radiating to the low back, accompanied by N/V.  Chronic intermittent dull LLQ pain, however last night/this morning had significant increase in pain now described as sharp.  Pain feels horrible for a few minutes, relieved for a few minutes, and continues the cycle.  Vomiting described as bilious, with 1 episode of diarrhea earlier today.  Also notes increased bladder pressure and urinary output, and further elaborates that she was told she has a bladder that is 1/6 the size of an average bladder which is led her to have chronic UTIs her whole life.  However notes this pain feels somewhat different than those UTIs.  Denies neck stiffness, chest pain, shortness of breath, dizziness, lightheadedness, vaginal pain, vaginal discharge.  Feels dehydrated.  Accompanied by her fianc.  The history is provided by the patient and medical records.  Abdominal Pain      Home Medications Prior to Admission medications   Medication Sig Start Date End Date Taking? Authorizing Provider  naproxen (NAPROSYN) 500 MG tablet Take 1 tablet (500 mg total) by mouth 2 (two) times daily. 07/04/22  Yes Cecil Cobbs, PA-C  ondansetron (ZOFRAN) 4 MG tablet Take 1 tablet (4 mg total) by mouth every 6 (six) hours. 07/04/22  Yes Cecil Cobbs, PA-C  citalopram (CELEXA) 20 MG tablet Take 20 mg by mouth daily.    [provider]  DULoxetine (CYMBALTA) 60 MG capsule Take 1 capsule (60 mg total) by mouth daily. For mood control Patient not taking: Reported on 08/01/2019 04/04/18   Money, Gerlene Burdock, FNP  gabapentin (NEURONTIN) 100 MG capsule Take 1 capsule (100 mg total)  by mouth 3 (three) times daily. For anxiety Patient not taking: Reported on 08/01/2019 04/03/18   Money, Gerlene Burdock, FNP  methocarbamol (ROBAXIN) 500 MG tablet Take 1 tablet (500 mg total) by mouth 2 (two) times daily. 01/24/22   Janell Quiet, PA-C  mirtazapine (REMERON) 15 MG tablet Take 1 tablet (15 mg total) by mouth at bedtime. For mood control and sleep Patient not taking: Reported on 08/01/2019 04/03/18   Money, Gerlene Burdock, FNP  traZODone (DESYREL) 50 MG tablet Take 1 tablet (50 mg total) by mouth at bedtime as needed for sleep. Patient not taking: Reported on 08/01/2019 04/03/18   Money, Gerlene Burdock, FNP      Allergies    Elemental sulfur, Sulfa antibiotics, and Aspartame and phenylalanine    Review of Systems   Review of Systems  Gastrointestinal:  Positive for abdominal pain.    Physical Exam Updated Vital Signs BP (!) 153/102 (BP Location: Right Arm)   Pulse 81   Temp 98.6 F (37 C) (Oral)   Resp 20   Ht 5\' 6"  (1.676 m)   Wt 79.4 kg   LMP 06/28/2019   SpO2 98%   BMI 28.25 kg/m  Physical Exam Vitals and nursing note reviewed.  Constitutional:      General: She is not in acute distress.    Appearance: She is well-developed. She is not ill-appearing, toxic-appearing or diaphoretic.     Comments: Appears uncomfortable  HENT:     Head: Normocephalic and atraumatic.  Eyes:     General: No scleral icterus.    Conjunctiva/sclera: Conjunctivae normal.  Cardiovascular:     Rate and Rhythm: Normal rate and regular rhythm.     Heart sounds: No murmur heard. Pulmonary:     Effort: Pulmonary effort is normal. No respiratory distress.     Breath sounds: Normal breath sounds.  Chest:     Chest wall: No tenderness.  Abdominal:     General: Abdomen is protuberant. Bowel sounds are normal. There is no distension.     Palpations: Abdomen is soft. There is no fluid wave or pulsatile mass.     Tenderness: There is abdominal tenderness in the periumbilical area and left lower quadrant.  There is no right CVA tenderness or left CVA tenderness. Negative signs include Murphy's sign and McBurney's sign.  Musculoskeletal:        General: No swelling.     Cervical back: Neck supple.  Skin:    General: Skin is warm and dry.     Capillary Refill: Capillary refill takes less than 2 seconds.     Coloration: Skin is not cyanotic or jaundiced.  Neurological:     Mental Status: She is alert and oriented to person, place, and time.  Psychiatric:        Mood and Affect: Mood normal. Affect is tearful.     ED Results / Procedures / Treatments   Labs (all labs ordered are listed, but only abnormal results are displayed) Labs Reviewed  COMPREHENSIVE METABOLIC PANEL - Abnormal; Notable for the following components:      Result Value   Glucose, Bld 120 (*)    All other components within normal limits  CBC - Abnormal; Notable for the following components:   WBC 13.6 (*)    RBC 5.15 (*)    All other components within normal limits  URINALYSIS, ROUTINE W REFLEX MICROSCOPIC - Abnormal; Notable for the following components:   APPearance CLOUDY (*)    Hgb urine dipstick LARGE (*)    Protein, ur 100 (*)    Leukocytes,Ua TRACE (*)    All other components within normal limits  URINALYSIS, MICROSCOPIC (REFLEX) - Abnormal; Notable for the following components:   Bacteria, UA FEW (*)    All other components within normal limits  CBC WITH DIFFERENTIAL/PLATELET - Abnormal; Notable for the following components:   WBC 13.4 (*)    RBC 5.16 (*)    Neutro Abs 11.2 (*)    All other components within normal limits  LIPASE, BLOOD    EKG None  Radiology CT Abdomen Pelvis W Contrast  Result Date: 07/04/2022 CLINICAL DATA:  Acute left lower quadrant abdominal pain. EXAM: CT ABDOMEN AND PELVIS WITH CONTRAST TECHNIQUE: Multidetector CT imaging of the abdomen and pelvis was performed using the standard protocol following bolus administration of intravenous contrast. RADIATION DOSE REDUCTION: This  exam was performed according to the departmental dose-optimization program which includes automated exposure control, adjustment of the mA and/or kV according to patient size and/or use of iterative reconstruction technique. CONTRAST:  OMNIPAQUE IOHEXOL 300 MG/ML  SOLN COMPARISON:  April 07, 2021. FINDINGS: Lower chest: No acute abnormality. Hepatobiliary: No focal liver abnormality is seen. No gallstones, gallbladder wall thickening, or biliary dilatation. Pancreas: Unremarkable. No pancreatic ductal dilatation or surrounding inflammatory changes. Spleen: Normal in size without focal abnormality. Adrenals/Urinary Tract: Adrenal glands appear normal. Nonobstructive left nephrolithiasis is noted. Moderate left hydroureteronephrosis is noted secondary to 7 mm calculus in distal left ureter. Urinary bladder  is decompressed. Stomach/Bowel: Stomach is within normal limits. Appendix appears normal. No evidence of bowel wall thickening, distention, or inflammatory changes. Diverticulosis of descending and sigmoid colon is noted without inflammation. Vascular/Lymphatic: Aortic atherosclerosis. No enlarged abdominal or pelvic lymph nodes. Reproductive: Status post hysterectomy. No adnexal masses. Other: No abdominal wall hernia or abnormality. No abdominopelvic ascites. Musculoskeletal: No acute or significant osseous findings. IMPRESSION: Moderate left hydroureteronephrosis is noted secondary to 7 mm distal left ureteral calculus. Diverticulosis of descending and sigmoid colon is noted without inflammation. Aortic Atherosclerosis (ICD10-I70.0). Electronically Signed   By: Lupita Raider M.D.   On: 07/04/2022 13:12    Procedures Procedures    Medications Ordered in ED Medications  cefTRIAXone (ROCEPHIN) 1 g in sodium chloride 0.9 % 100 mL IVPB (has no administration in time range)  sodium chloride 0.9 % bolus 1,000 mL (1,000 mLs Intravenous New Bag/Given 07/04/22 1309)  morphine (PF) 4 MG/ML injection 4 mg (4 mg  Intravenous Given 07/04/22 1310)  ondansetron (ZOFRAN) injection 4 mg (4 mg Intravenous Given 07/04/22 1309)  iohexol (OMNIPAQUE) 300 MG/ML solution 100 mL (100 mLs Intravenous Contrast Given 07/04/22 1250)  ketorolac (TORADOL) 30 MG/ML injection 30 mg (30 mg Intravenous Given 07/04/22 1322)    ED Course/ Medical Decision Making/ A&P Clinical Course as of 07/04/22 1425  Sat Jul 04, 2022  1359 Consulted with Dr. Cardell Peach of urology.  Patient appears hemodynamically stable and has had pain improvement with treatment in the ED so far.  Recommended use of outpatient Flomax, Zofran, and naproxen with close outpatient follow-up for further management.  Agrees with plan for single dose IV Rocephin prior to discharge.   [AC]    Clinical Course User Index [AC] Cecil Cobbs, PA-C                           Medical Decision Making Amount and/or Complexity of Data Reviewed Labs: ordered. Radiology: ordered.  Risk Prescription drug management.   51 y.o. female presents to the ED for concern of Abdominal Pain     This involves an extensive number of treatment options, and is a complaint that carries with it a high risk of complications and morbidity.  The emergent differential diagnosis prior to evaluation includes, but is not limited to: Diverticulitis, renal calculi, pyelonephritis  This is not an exhaustive differential.   Past Medical History / Co-morbidities / Social History: Hx of chronic tobacco use, MDD, migraines, and prior PE Social Determinants of Health include: chronic tobacco use for which cessation counseling was provided  Additional History:  Obtained by chart review.  Notably surgical operative notes which indicate total hysterectomy with bilateral salpingectomy, ovaries were not excised  Lab Tests: I ordered, and personally interpreted labs.  The pertinent results include:   UA with trace leukocytes, 6-10 WBC, 11-20 RBC, few bacteria, negative nitrites Creatinine 0.87, BUN  12 WBC 13.6 with leukocytosis Lipase 40  Imaging Studies: I ordered imaging studies including CT abdomen/pelvis.   I independently visualized and interpreted imaging which showed 7 mm distal left ureteral calculus with moderate hydronephrosis.  Also visualized diverticulosis without evidence of diverticulitis I agree with the radiologist interpretation.  Cardiac Monitoring: The patient was maintained on a cardiac monitor.  I personally viewed and interpreted the cardiac monitored which showed an underlying rhythm of: NSR  ED Course / Critical Interventions: Pt uncomfortable appearing on exam.  Presenting with LLQ abdominal pain radiating to left lower back.  Patient is  nontoxic, nonseptic appearing, in no apparent distress.  Patient's pain and other symptoms adequately managed in ED.  Fluid bolus, toradol, zofran given.  Labs, history and vitals reviewed.  Hx of hysterectomy with salpingectomy however ovaries left behind.  Recurrent hx of UTIs due to bladder's small size per pt.  Patient does not meet the SIRS or Sepsis criteria.  Plan for CT imaging and reassess. Imaging demonstrates moderate left hydroureteronephrosis secondary to 7 mm renal calculi.  Diverticulosis without evidence of diverticulitis also appreciated.   On repeat exam patient does not have a surgical abdomen and there are no peritoneal signs.  Remarkable improvement appreciated with Toradol.  Due to elevated WBC with leukocytosis, mild UTI, and 72mm stone size, consulted with Dr. Cardell Peach of Urology.  Believes due to her hemodynamic stability, retained renal function, negative nitrite and mild UTI, and remarkable improvement during encounter, she may safely follow up with urology in 2-3 days for further management.  Agrees with one time dose of Rocephin IV prior to discharge, and use of naproxen and zofran until follow up.  Also prescribed keflex for UTI.  Pt unable to take flomax due to anaphylaxis reaction with sulfa products.   Recommend continued conservative symptomatic management discussed.  Patient satisfied with today's encounter.  Patient in NAD and in good condition at time of discharge.  I have reviewed the patients home medicines and have made adjustments as needed.  Disposition: Considered admission and after reviewing the patient's encounter today, I feel that the patient would benefit from conservative management until close outpatient urology follow up.  Discussed course of treatment with the patient, whom demonstrated understanding.  Patient in agreement and has no further questions.    I discussed this case with my attending, Dr. Jacqulyn Bath, who agreed with the proposed treatment course and cosigned this note including patient's presenting symptoms, physical exam, and planned diagnostics and interventions.  Attending physician stated agreement with plan or made changes to plan which were implemented.     This chart was dictated using voice recognition software.  Despite best efforts to proofread, errors can occur which can change the documentation meaning.         Final Clinical Impression(s) / ED Diagnoses Final diagnoses:  Hydroureteronephrosis  Renal calculus, left  LLQ abdominal pain  Diverticulosis of colon without hemorrhage    Rx / DC Orders ED Discharge Orders          Ordered    naproxen (NAPROSYN) 500 MG tablet  2 times daily        07/04/22 1422    ondansetron (ZOFRAN) 4 MG tablet  Every 6 hours        07/04/22 1422              Sandrea Hammond 07/05/22 2009    Maia Plan, MD 07/09/22 [phone]

## 2022-07-04 NOTE — Discharge Instructions (Addendum)
Imaging and work-up today shows a 7 mm stone with some fluid around the kidney.  After speaking with Dr. Cardell Peach of urology, he is recommended close follow-up in his office in the next few days.  Please call early Tuesday morning to be seen Tuesday or Wednesday this week.  3 medications have been sent to your pharmacy.  They are as follows: Naproxen-an anti-inflammatory.  Take 1 tablet every 12 hours as needed for pain relief.  Always take with plenty of food and water.  You may take Tylenol at the same time as this medication, however do not take ibuprofen or Motrin at the same time as naproxen, as they are in the same class of medications. Zofran-an antinausea medication.  Take 1 tablet every 6 hours as needed for nausea relief. Keflex-an antibiotic.  Take 1 capsule every 8 hours for the next 7 days for UTI treatment.  Again always take with plenty of food and water.  You may also take a probiotic daily while taking this to reduce the risk of an antibiotic associated yeast infection.  Please focus on remaining very hydrated, resting, and maintaining nutritional intake until follow-up with urology.  However return to the ED for any new or worsening symptoms as discussed.

## 2022-07-04 NOTE — ED Triage Notes (Signed)
Pt arrives pov, slow gait, c/o LLQ pain radiating to lower back today. ALso reports n/v. Pt also c/o RLE ankle swelling "for months"

## 2022-07-08 ENCOUNTER — Other Ambulatory Visit: Payer: Self-pay | Admitting: Urology

## 2022-07-09 ENCOUNTER — Encounter (HOSPITAL_COMMUNITY)
Admission: RE | Admit: 2022-07-09 | Discharge: 2022-07-09 | Disposition: A | Discharge status: Home / Self Care | Payer: Self-pay | Source: Ambulatory Visit | Attending: Urology | Admitting: Urology

## 2022-07-09 ENCOUNTER — Other Ambulatory Visit: Payer: Self-pay

## 2022-07-09 ENCOUNTER — Encounter (HOSPITAL_COMMUNITY): Payer: Self-pay

## 2022-07-09 DIAGNOSIS — Z01812 Encounter for preprocedural laboratory examination: Secondary | ICD-10-CM | POA: Insufficient documentation

## 2022-07-09 HISTORY — DX: Gastro-esophageal reflux disease without esophagitis: K21.9

## 2022-07-09 HISTORY — DX: Personal history of urinary calculi: Z87.442

## 2022-07-09 HISTORY — DX: Pneumonia, unspecified organism: J18.9

## 2022-07-09 HISTORY — DX: Anemia, unspecified: D64.9

## 2022-07-09 NOTE — Patient Instructions (Addendum)
SURGICAL WAITING ROOM VISITATION Patients having surgery or a procedure may have no more than 2 support people in the waiting area - these visitors may rotate.   Children under the age of 42 must have an adult with them who is not the patient. If the patient needs to stay at the hospital during part of their recovery, the visitor guidelines for inpatient rooms apply. Pre-op nurse will coordinate an appropriate time for 1 support person to accompany patient in pre-op.  This support person may not rotate.    Please refer to the [facility] website for the visitor guidelines for Inpatients (after your surgery is over and you are in a regular room).      Your procedure is scheduled on: 07-17-22   Report to [facility] Main Entrance    Report to admitting at 10:45 AM   Call this number if you have problems the morning of surgery [phone]   Do not eat food :After Midnight.   After Midnight you may have the following liquids until 10:00 AM DAY OF SURGERY  Water Non-Citrus Juices (without pulp, NO RED) Carbonated Beverages Black Coffee (NO MILK/CREAM OR CREAMERS, sugar ok)  Clear Tea (NO MILK/CREAM OR CREAMERS, sugar ok) regular and decaf                             Plain Jell-O (NO RED)                                           Fruit ices (not with fruit pulp, NO RED)                                     Popsicles (NO RED)                                                               Sports drinks like Gatorade (NO RED)                       If you have questions, please contact your surgeon's office.   FOLLOW ANY ADDITIONAL PRE OP INSTRUCTIONS YOU RECEIVED FROM YOUR SURGEON'S OFFICE!!!     Oral Hygiene is also important to reduce your risk of infection.                                    Remember - BRUSH YOUR TEETH THE MORNING OF SURGERY WITH YOUR REGULAR TOOTHPASTE   Do NOT smoke after Midnight   Take these medicines the morning of surgery with A SIP OF WATER:   Cephalexin, Oxycodone if needed                           You may not have any metal on your body including hair pins, jewelry, and body piercing             Do not wear make-up, lotions, powders, perfumes or  deodorant  Do not wear nail polish including gel and S&S, artificial/acrylic nails, or any other type of covering on natural nails including finger and toenails. If you have artificial nails, gel coating, etc. that needs to be removed by a nail salon please have this removed prior to surgery or surgery may need to be canceled/ delayed if the surgeon/ anesthesia feels like they are unable to be safely monitored.   Do not shave  48 hours prior to surgery.       Do not bring valuables to the hospital. [facility] IS NOT RESPONSIBLE   FOR VALUABLES.   Contacts, dentures or bridgework may not be worn into surgery.  DO NOT BRING YOUR HOME MEDICATIONS TO THE HOSPITAL. PHARMACY WILL DISPENSE MEDICATIONS LISTED ON YOUR MEDICATION LIST TO YOU DURING YOUR ADMISSION IN THE HOSPITAL!   Patients discharged on the day of surgery will not be allowed to drive home.  Someone NEEDS to stay with you for the first 24 hours after anesthesia.  Please read over the following fact sheets you were given: IF YOU HAVE QUESTIONS ABOUT YOUR PRE-OP INSTRUCTIONS PLEASE CALL [phone] [facility] - Preparing for Surgery Before surgery, you can play an important role.  Because skin is not sterile, your skin needs to be as free of germs as possible.  You can reduce the number of germs on your skin by washing with CHG (chlorahexidine gluconate) soap before surgery.  CHG is an antiseptic cleaner which kills germs and bonds with the skin to continue killing germs even after washing. Please DO NOT use if you have an allergy to CHG or antibacterial soaps.  If your skin becomes reddened/irritated stop using the CHG and inform your nurse when you arrive at Short Stay. Do not shave (including legs and underarms) for at  least 48 hours prior to the first CHG shower.  You may shave your face/neck.  Please follow these instructions carefully:  1.  Shower with CHG Soap the night before surgery and the  morning of surgery.  2.  If you choose to wash your hair, wash your hair first as usual with your normal  shampoo.  3.  After you shampoo, rinse your hair and body thoroughly to remove the shampoo.                             4.  Use CHG as you would any other liquid soap.  You can apply chg directly to the skin and wash.  Gently with a scrungie or clean washcloth.  5.  Apply the CHG Soap to your body ONLY FROM THE NECK DOWN.   Do   not use on face/ open                           Wound or open sores. Avoid contact with eyes, ears mouth and   genitals (private parts).                       Wash face,  Genitals (private parts) with your normal soap.             6.  Wash thoroughly, paying special attention to the area where your    surgery  will be performed.  7.  Thoroughly rinse your body with warm water from the neck down.  8.  DO NOT shower/wash with your normal  soap after using and rinsing off the CHG Soap.                9.  Pat yourself dry with a clean towel.            10.  Wear clean pajamas.            11.  Place clean sheets on your bed the night of your first shower and do not  sleep with pets. Day of Surgery : Do not apply any lotions/deodorants the morning of surgery.  Please wear clean clothes to the hospital/surgery center.  FAILURE TO FOLLOW THESE INSTRUCTIONS MAY RESULT IN THE CANCELLATION OF YOUR SURGERY  PATIENT SIGNATURE_________________________________  NURSE SIGNATURE__________________________________  ________________________________________________________________________

## 2022-07-09 NOTE — Progress Notes (Addendum)
COVID Vaccine Completed:  Yes   Date of COVID positive in last 90 days:  No  PCP - No PCP Cardiologist - N/A  Chest x-ray - N/A EKG - N/A Stress Test - greater than 2 years Epic ECHO - greater than 2 years Epic Cardiac Cath - N/A Pacemaker/ICD device last checked: Spinal Cord Stimulator:N/A  Bowel Prep - N/A  Sleep Study - N/A CPAP -   Fasting Blood Sugar - N/A Checks Blood Sugar _____ times a day  Blood Thinner Instructions:N/A Aspirin Instructions: Last Dose:  Activity level:  Can go up a flight of stairs and perform activities of daily living without stopping and without symptoms of chest pain or shortness of breath.  Seizure in the past, no recent seizure in years  Anesthesia review:N/A  Patient denies shortness of breath, fever, cough and chest pain at PAT appointment  Patient verbalized understanding of instructions that were given to them at the PAT appointment. Patient was also instructed that they will need to review over the PAT instructions again at home before surgery.

## 2022-07-15 ENCOUNTER — Other Ambulatory Visit (HOSPITAL_COMMUNITY): Payer: Self-pay

## 2022-07-15 MED ORDER — OXYCODONE HCL 5 MG PO TABS
5.0000 mg | ORAL_TABLET | Freq: Four times a day (QID) | ORAL | 0 refills | Status: DC | PRN
  Filled 2022-07-15: qty 10, 3d supply, fill #0

## 2022-07-17 ENCOUNTER — Ambulatory Visit (HOSPITAL_BASED_OUTPATIENT_CLINIC_OR_DEPARTMENT_OTHER): Payer: Self-pay | Admitting: Anesthesiology

## 2022-07-17 ENCOUNTER — Other Ambulatory Visit: Payer: Self-pay

## 2022-07-17 ENCOUNTER — Ambulatory Visit (HOSPITAL_COMMUNITY)
Admission: RE | Admit: 2022-07-17 | Discharge: 2022-07-17 | Disposition: A | Discharge status: Home / Self Care | Payer: Self-pay | Source: Ambulatory Visit | Attending: Urology | Admitting: Urology

## 2022-07-17 ENCOUNTER — Encounter (HOSPITAL_COMMUNITY): Payer: Self-pay | Admitting: Urology

## 2022-07-17 ENCOUNTER — Encounter (HOSPITAL_COMMUNITY)
Admission: RE | Disposition: A | Discharge status: Home / Self Care | Payer: Self-pay | Source: Ambulatory Visit | Attending: Urology

## 2022-07-17 ENCOUNTER — Ambulatory Visit (HOSPITAL_COMMUNITY): Payer: Self-pay | Admitting: Anesthesiology

## 2022-07-17 ENCOUNTER — Ambulatory Visit (HOSPITAL_COMMUNITY): Payer: Self-pay

## 2022-07-17 DIAGNOSIS — F17200 Nicotine dependence, unspecified, uncomplicated: Secondary | ICD-10-CM | POA: Insufficient documentation

## 2022-07-17 DIAGNOSIS — N132 Hydronephrosis with renal and ureteral calculous obstruction: Secondary | ICD-10-CM | POA: Insufficient documentation

## 2022-07-17 DIAGNOSIS — F17210 Nicotine dependence, cigarettes, uncomplicated: Secondary | ICD-10-CM

## 2022-07-17 DIAGNOSIS — N289 Disorder of kidney and ureter, unspecified: Secondary | ICD-10-CM

## 2022-07-17 DIAGNOSIS — N201 Calculus of ureter: Secondary | ICD-10-CM

## 2022-07-17 DIAGNOSIS — J189 Pneumonia, unspecified organism: Secondary | ICD-10-CM

## 2022-07-17 DIAGNOSIS — Z87440 Personal history of urinary (tract) infections: Secondary | ICD-10-CM | POA: Insufficient documentation

## 2022-07-17 HISTORY — PX: CYSTOSCOPY WITH URETEROSCOPY AND STENT PLACEMENT: SHX6377

## 2022-07-17 SURGERY — CYSTOURETEROSCOPY, WITH STENT INSERTION
Anesthesia: General | Laterality: Left

## 2022-07-17 MED ORDER — KETOROLAC TROMETHAMINE 30 MG/ML IJ SOLN
INTRAMUSCULAR | Status: DC | PRN
  Administered 2022-07-17: 30 mg via INTRAVENOUS

## 2022-07-17 MED ORDER — KETOROLAC TROMETHAMINE 30 MG/ML IJ SOLN
INTRAMUSCULAR | Status: AC
  Filled 2022-07-17: qty 1

## 2022-07-17 MED ORDER — DEXAMETHASONE SODIUM PHOSPHATE 10 MG/ML IJ SOLN
INTRAMUSCULAR | Status: AC
  Filled 2022-07-17: qty 1

## 2022-07-17 MED ORDER — LIDOCAINE HCL (PF) 2 % IJ SOLN
INTRAMUSCULAR | Status: AC
  Filled 2022-07-17: qty 5

## 2022-07-17 MED ORDER — PROPOFOL 10 MG/ML IV BOLUS
INTRAVENOUS | Status: AC
Start: 2022-07-17 — End: ?
  Filled 2022-07-17: qty 20

## 2022-07-17 MED ORDER — ACETAMINOPHEN 10 MG/ML IV SOLN
INTRAVENOUS | Status: AC
  Filled 2022-07-17: qty 100

## 2022-07-17 MED ORDER — IOHEXOL 300 MG/ML  SOLN
INTRAMUSCULAR | Status: DC | PRN
  Administered 2022-07-17: 17 mL

## 2022-07-17 MED ORDER — SODIUM CHLORIDE 0.9 % IR SOLN
Status: DC | PRN
  Administered 2022-07-17: 6000 mL via INTRAVESICAL

## 2022-07-17 MED ORDER — ACETAMINOPHEN 10 MG/ML IV SOLN
INTRAVENOUS | Status: DC | PRN
  Administered 2022-07-17: 1000 mg via INTRAVENOUS

## 2022-07-17 MED ORDER — ONDANSETRON HCL 4 MG/2ML IJ SOLN
INTRAMUSCULAR | Status: DC | PRN
  Administered 2022-07-17: 4 mg via INTRAVENOUS

## 2022-07-17 MED ORDER — MIDAZOLAM HCL 5 MG/5ML IJ SOLN
INTRAMUSCULAR | Status: DC | PRN
  Administered 2022-07-17: 2 mg via INTRAVENOUS

## 2022-07-17 MED ORDER — LACTATED RINGERS IV SOLN: INTRAVENOUS | Status: DC

## 2022-07-17 MED ORDER — FENTANYL CITRATE (PF) 100 MCG/2ML IJ SOLN
INTRAMUSCULAR | Status: DC | PRN
  Administered 2022-07-17 (×2): 50 ug via INTRAVENOUS

## 2022-07-17 MED ORDER — CEFAZOLIN SODIUM-DEXTROSE 2-4 GM/100ML-% IV SOLN
2.0000 g | INTRAVENOUS | Status: AC
  Administered 2022-07-17: 2 g via INTRAVENOUS
  Filled 2022-07-17: qty 100

## 2022-07-17 MED ORDER — DEXAMETHASONE SODIUM PHOSPHATE 10 MG/ML IJ SOLN
INTRAMUSCULAR | Status: DC | PRN
  Administered 2022-07-17: 10 mg via INTRAVENOUS

## 2022-07-17 MED ORDER — MIDAZOLAM HCL 2 MG/2ML IJ SOLN
INTRAMUSCULAR | Status: AC
  Filled 2022-07-17: qty 2

## 2022-07-17 MED ORDER — FENTANYL CITRATE PF 50 MCG/ML IJ SOSY: 25.0000 ug | PREFILLED_SYRINGE | INTRAMUSCULAR | Status: DC | PRN

## 2022-07-17 MED ORDER — PROPOFOL 10 MG/ML IV BOLUS
INTRAVENOUS | Status: DC | PRN
  Administered 2022-07-17: 180 mg via INTRAVENOUS
  Administered 2022-07-17: 20 mg via INTRAVENOUS

## 2022-07-17 MED ORDER — CHLORHEXIDINE GLUCONATE 0.12 % MT SOLN
15.0000 mL | Freq: Once | OROMUCOSAL | Status: AC
  Administered 2022-07-17: 15 mL via OROMUCOSAL

## 2022-07-17 MED ORDER — ORAL CARE MOUTH RINSE: 15.0000 mL | Freq: Once | OROMUCOSAL | Status: AC

## 2022-07-17 MED ORDER — MEPERIDINE HCL 50 MG/ML IJ SOLN: 6.2500 mg | INTRAMUSCULAR | Status: DC | PRN

## 2022-07-17 MED ORDER — ONDANSETRON HCL 4 MG/2ML IJ SOLN
INTRAMUSCULAR | Status: AC
  Filled 2022-07-17: qty 2

## 2022-07-17 MED ORDER — FENTANYL CITRATE (PF) 100 MCG/2ML IJ SOLN
INTRAMUSCULAR | Status: AC
  Filled 2022-07-17: qty 2

## 2022-07-17 MED ORDER — LIDOCAINE 2% (20 MG/ML) 5 ML SYRINGE
INTRAMUSCULAR | Status: DC | PRN
  Administered 2022-07-17: 100 mg via INTRAVENOUS

## 2022-07-17 MED ORDER — AMISULPRIDE (ANTIEMETIC) 5 MG/2ML IV SOLN: 10.0000 mg | Freq: Once | INTRAVENOUS | Status: DC | PRN

## 2022-07-17 MED ORDER — OXYCODONE HCL 5 MG PO TABS: 5.0000 mg | ORAL_TABLET | Freq: Four times a day (QID) | ORAL | 0 refills | Status: DC | PRN

## 2022-07-17 MED ORDER — PROMETHAZINE HCL 25 MG/ML IJ SOLN: 6.2500 mg | INTRAMUSCULAR | Status: DC | PRN

## 2022-07-17 SURGICAL SUPPLY — 23 items
BAG URO CATCHER STRL LF (MISCELLANEOUS) ×1 IMPLANT
BASKET LASER NITINOL 1.9FR (BASKET) IMPLANT
BASKET ZERO TIP NITINOL 2.4FR (BASKET) IMPLANT
BSKT STON RTRVL ZERO TP 2.4FR (BASKET) ×1
CATH URETL OPEN END 6FR 70 (CATHETERS) ×1 IMPLANT
CLOTH BEACON ORANGE TIMEOUT ST (SAFETY) ×1 IMPLANT
EXTRACTOR STONE 1.7FRX115CM (UROLOGICAL SUPPLIES) IMPLANT
GLOVE BIO SURGEON STRL SZ7.5 (GLOVE) ×1 IMPLANT
GOWN STRL REUS W/ TWL XL LVL3 (GOWN DISPOSABLE) ×1 IMPLANT
GOWN STRL REUS W/TWL XL LVL3 (GOWN DISPOSABLE) ×1
GUIDEWIRE ANG ZIPWIRE 038X150 (WIRE) IMPLANT
GUIDEWIRE STR DUAL SENSOR (WIRE) ×1 IMPLANT
KIT TURNOVER KIT A (KITS) IMPLANT
LASER FIB FLEXIVA PULSE ID 365 (Laser) IMPLANT
MANIFOLD NEPTUNE II (INSTRUMENTS) ×1 IMPLANT
PACK CYSTO (CUSTOM PROCEDURE TRAY) ×1 IMPLANT
SHEATH NAVIGATOR HD 11/13X28 (SHEATH) IMPLANT
SHEATH NAVIGATOR HD 11/13X36 (SHEATH) IMPLANT
STENT URET 6FRX24 CONTOUR (STENTS) IMPLANT
TRACTIP FLEXIVA PULS ID 200XHI (Laser) IMPLANT
TRACTIP FLEXIVA PULSE ID 200 (Laser) ×1
TUBING CONNECTING 10 (TUBING) ×1 IMPLANT
TUBING UROLOGY SET (TUBING) ×1 IMPLANT

## 2022-07-17 NOTE — Discharge Instructions (Signed)

## 2022-07-17 NOTE — Anesthesia Preprocedure Evaluation (Addendum)
Anesthesia Evaluation  Patient identified by MRN, date of birth, ID band Patient awake    Reviewed: Allergy & Precautions, NPO status , Patient's Chart, lab work & pertinent test results  Airway Mallampati: II  TM Distance: >3 FB Neck ROM: Full    Dental  (+) Dental Advisory Given, Missing, Chipped,    Pulmonary pneumonia, Current Smoker,    Pulmonary exam normal breath sounds clear to auscultation       Cardiovascular Normal cardiovascular exam Rhythm:Regular Rate:Normal  Echo 2016 - Left ventricle: The cavity size was normal. There was mild concentric hypertrophy. Systolic function was normal. The estimated ejection fraction was in the range of 60% to 65%. Wall motion was normal; there were no regional wall motion abnormalities. Left ventricular diastolic function parameters were normal.  - Left atrium: The atrium was normal in size.  - Right ventricle: The cavity size was normal. Wall thickness was normal. Systolic function was normal.  - Right atrium: The atrium was normal in size.   Impressions:  - LVEF 60-65%, mild LVH, normal wall motion, normal RV function, normal biatrial size, normal diastolic function.    Neuro/Psych  Headaches, Seizures -,  PSYCHIATRIC DISORDERS Depression    GI/Hepatic Neg liver ROS, GERD  ,  Endo/Other  negative endocrine ROS  Renal/GU Renal disease     Musculoskeletal negative musculoskeletal ROS (+)   Abdominal   Peds  Hematology  (+) Blood dyscrasia, anemia ,   Anesthesia Other Findings   Reproductive/Obstetrics                           Anesthesia Physical Anesthesia Plan  ASA: 3  Anesthesia Plan: General   Post-op Pain Management: Ofirmev IV (intra-op)*   Induction: Intravenous  PONV Risk Score and Plan: 3 and Ondansetron, Dexamethasone and Treatment may vary due to age or medical condition  Airway Management Planned:  LMA  Additional Equipment: None  Intra-op Plan:   Post-operative Plan: Extubation in OR  Informed Consent: I have reviewed the patients History and Physical, chart, labs and discussed the procedure including the risks, benefits and alternatives for the proposed anesthesia with the patient or authorized representative who has indicated his/her understanding and acceptance.     Dental advisory given  Plan Discussed with: CRNA  Anesthesia Plan Comments:        Anesthesia Quick Evaluation

## 2022-07-17 NOTE — Op Note (Signed)
Operative Note  Preoperative diagnosis:  1.  Left ureteral calculus  Postoperative diagnosis: 1.  Left ureteral calculus  Procedure(s): 1.  Cystoscopy with left retrograde pyelogram, left ureteroscopy with laser lithotripsy and stone extraction, left ureteral stent placement  Surgeon: Modena Slater, MD  Assistants: None  Anesthesia: General  Complications: None immediate  EBL: Minimal  Specimens: 1.  Renal calculus  Drains/Catheters: 1.  6 x 24 double-J ureteral stent  Intraoperative findings: 1.  Normal urethra and bladder 2.  Approximately 7 mm distal left ureteral calculus fragmented to smaller fragments and then basket extracted.  Retrograde pyelogram revealed no filling defect and there was moderate hydronephrosis after treatment of the stone.  Indication: 51 year old female with a left ureteral calculus presents for the previously mentioned operation.  Description of procedure:  The patient was identified and consent was obtained.  The patient was taken to the operating room and placed in the supine position.  The patient was placed under general anesthesia.  Perioperative antibiotics were administered.  The patient was placed in dorsal lithotomy.  Patient was prepped and draped in a standard sterile fashion and a timeout was performed.  A 21 French rigid cystoscope was advanced into the urethra and into the bladder.  Complete cystoscopy was performed with no abnormal findings.  The left ureter was cannulated with a sensor wire which was then advanced up to the kidney under fluoroscopic guidance.  Semirigid ureteroscopy was performed alongside the wire up to the stone which was laser fragmented to smaller fragments and then basket extracted.  Some of the fragments were collected for specimen.  Once I cleared the ureter, I inspected the entire ureter with the ureteroscope up to the renal pelvis.  No other calculi were seen.  She did have some significant edema at the level of  stone impaction.  I shot a retrograde pyelogram through the scope with findings noted above.  I withdrew the scope visualizing the ureter upon removal with no additional findings.  I backloaded the wire onto rigid cystoscope and advanced that into the bladder followed by routine placement of a 6 x 24 double-J ureteral stent.  Fluoroscopy confirmed proximal placement and direct visualization confirmed a good coil within the bladder.  I drained the bladder and withdrew the scope.  Patient tolerated procedure well was stable postoperative.  Plan: Follow-up in about 1 week for stent removal

## 2022-07-17 NOTE — Anesthesia Procedure Notes (Signed)
Procedure Name: LMA Insertion Date/Time: 07/17/2022 12:32 PM  Performed by: Briant Sites, CRNAPre-anesthesia Checklist: Patient identified, Emergency Drugs available, Suction available and Patient being monitored Patient Re-evaluated:Patient Re-evaluated prior to induction Oxygen Delivery Method: Circle system utilized Preoxygenation: Pre-oxygenation with 100% oxygen Induction Type: IV induction Ventilation: Mask ventilation without difficulty LMA: LMA inserted LMA Size: 4.0 Number of attempts: 1 Airway Equipment and Method: Bite block Placement Confirmation: positive ETCO2 Tube secured with: Tape Dental Injury: Teeth and Oropharynx as per pre-operative assessment

## 2022-07-17 NOTE — Transfer of Care (Signed)
Immediate Anesthesia Transfer of Care Note  Patient: Jasmine Christian  Procedure(s) Performed: CYSTOSCOPY WITH LEFT URETEROSCOPY LASER LITHOTRIPSYAND STENT PLACEMENT (Left)  Patient Location: PACU  Anesthesia Type:General  Level of Consciousness: drowsy  Airway & Oxygen Therapy: Patient Spontanous Breathing and Patient connected to face mask oxygen  Post-op Assessment: Report given to RN  Post vital signs: Reviewed and stable  Last Vitals:  Vitals Value Taken Time  BP 139/80 07/17/22 1322  Temp    Pulse 57 07/17/22 1324  Resp 18 07/17/22 1324  SpO2 100 % 07/17/22 1324  Vitals shown include unvalidated device data.  Last Pain:  Vitals:   07/17/22 1125  TempSrc: Oral         Complications: No notable events documented.

## 2022-07-17 NOTE — H&P (Signed)
CC/HPI: CC: Left ureteral calculus  HPI:  [id number]  51 year old female went to the emergency department for left-sided flank pain. She was found to have a 7 mm distal left ureteral calculus with upstream hydronephrosis. She had a mild white blood cell count of 13.4, creatinine 0.87, urinalysis with trace leukocyte, few bacteria, 6-10 WBC. Urine culture was not sent. She has not had a fever or dysuria. No change in voiding complaints. She does have some intermittent groin irritation and left-sided flank pain. Today he is afebrile with normal blood pressure and normal heart rate. Pain is relatively well controlled on naproxen. She is currently taking Keflex. She has had about 3 stones in the past, the first 1 about 20 years ago. She does have a history of UTI but no symptoms of that currently. She has always passed her stones. Never required intervention.     ALLERGIES: Sulfa    MEDICATIONS: Cephalexin  Naproxen     GU PSH: None   NON-GU PSH: Bilateral Tubal Ligation Hysterectomy     GU PMH: None     PMH Notes:  1898-11-02 00:00:00 - Note: Normal Routine History And Physical Adult   NON-GU PMH: Anxiety Arrhythmia Arthritis Depression DVT, History Pulmonary Embolism, History    FAMILY HISTORY: 2 daughters - Other 2 sons - Other nephrolithiasis - Runs in Family   SOCIAL HISTORY: Marital Status: Single Race: Black or African American Current Smoking Status: Patient smokes.   Tobacco Use Assessment Completed: Used Tobacco in last 30 days? Does not drink caffeine.    REVIEW OF SYSTEMS:    GU Review Female:   Patient reports frequent urination, hard to postpone urination, burning /pain with urination, get up at night to urinate, trouble starting your stream, and have to strain to urinate. Patient denies leakage of urine, stream starts and stops, and being pregnant.  Gastrointestinal (Upper):   Patient reports nausea, vomiting, and indigestion/ heartburn.   Gastrointestinal  (Lower):   Patient denies diarrhea and constipation.  Constitutional:   Patient denies fever, night sweats, weight loss, and fatigue.  Skin:   Patient denies skin rash/ lesion and itching.  Eyes:   Patient denies blurred vision and double vision.  Ears/ Nose/ Throat:   Patient reports sinus problems. Patient denies sore throat.  Hematologic/Lymphatic:   Patient reports swollen glands and easy bruising.   Cardiovascular:   Patient reports leg swelling and chest pains.   Respiratory:   Patient denies cough and shortness of breath.  Endocrine:   Patient denies excessive thirst.  Musculoskeletal:   Patient reports back pain. Patient denies joint pain.  Neurological:   Patient reports headaches. Patient denies dizziness.  Psychologic:   Patient reports depression and anxiety.    VITAL SIGNS:      07/08/2022 08:45 AM  Weight 180 lb / 81.65 kg  Height 66 in / 167.64 cm  BP 139/81 mmHg  Heart Rate 65 /min  Temperature 97.8 F / 36.5 C  BMI 29.0 kg/m   MULTI-SYSTEM PHYSICAL EXAMINATION:    Constitutional: Well-nourished. No physical deformities. Normally developed. Good grooming.  Respiratory: No labored breathing, no use of accessory muscles.   Cardiovascular: Normal temperature, normal extremity pulses, no swelling, no varicosities.  Skin: No paleness, no jaundice, no cyanosis. No lesion, no ulcer, no rash.  Neurologic / Psychiatric: Oriented to time, oriented to place, oriented to person. No depression, no anxiety, no agitation.  Gastrointestinal: No mass, no tenderness, no rigidity, non obese abdomen. Mild left-sided flank pain  Eyes: Normal conjunctivae. Normal eyelids.  Musculoskeletal: Normal gait and station of head and neck.     Complexity of Data:  Source Of History:  Patient  Records Review:   Previous Doctor Records, Previous Patient Records  Urine Test Review:   Urinalysis   PROCEDURES:          Urinalysis w/Scope Dipstick Dipstick Cont'd Micro  Color: Yellow Bilirubin:  Neg mg/dL WBC/hpf: 0 - 5/hpf  Appearance: Slightly Cloudy Ketones: Neg mg/dL RBC/hpf: 10 - 09/TOI  Specific Gravity: 1.025 Blood: 1+ ery/uL Bacteria: Few (10-25/hpf)  pH: 6.0 Protein: Neg mg/dL Cystals: NS (Not Seen)  Glucose: Neg mg/dL Urobilinogen: 1.0 mg/dL Casts: NS (Not Seen)    Nitrites: Neg Trichomonas: Not Present    Leukocyte Esterase: Trace leu/uL Mucous: Present      Epithelial Cells: 0 - 5/hpf      Yeast: NS (Not Seen)      Sperm: Not Present    ASSESSMENT:      ICD-10 Details  1 GU:   Ureteral calculus - N20.1 Undiagnosed New Problem  2   Ureteral obstruction secondary to calculous - N13.2 Undiagnosed New Problem   PLAN:            Medications New Meds: Oxycodone Hcl 5 mg tablet 1 tablet PO Q 6 H PRN For pain  #10  0 Refill(s)  Pharmacy Name:  [id number]  Address:  [address]   [place], [place] [id number]  Phone:  [phone]  Fax:  [phone]            Orders Labs Urine Culture          Document Letter(s):  Created for Patient: Clinical Summary         Notes:   She had a anaphylactic reaction to sulfa so we will avoid Flomax. I will send in some oxycodone for breakthrough pain. She has a strainer. If she passes the stone, she will let us know.   We discussed the management of urinary stones. These options include observation, ureteroscopy, and shockwave lithotripsy. We discussed which options are relevant to these particular stones. We discussed the natural history of stones as well as the complications of untreated stones and the impact on quality of life without treatment as well as with each of the above listed treatments. We also discussed the efficacy of each treatment in its ability to clear the stone burden. With any of these management options I discussed the signs and symptoms of infection and the need for emergent treatment should these be experienced. For each option we discussed the ability of each procedure to clear the  patient of their stone burden.   For observation I described the risks which include but are not limited to silent renal damage, life-threatening infection, need for emergent surgery, failure to pass stone, and pain.   For ureteroscopy I described the risks which include heart attack, stroke, pulmonary embolus, death, bleeding, infection, damage to contiguous structures, positioning injury, ureteral stricture, ureteral avulsion, ureteral injury, need for ureteral stent, inability to perform ureteroscopy, need for an interval procedure, inability to clear stone burden, stent discomfort and pain.   For shockwave lithotripsy I described the risks which include arrhythmia, kidney contusion, kidney hemorrhage, need for transfusion, pain, inability to break up stone, inability to pass stone fragments, Steinstrasse, infection associated with obstructing stones, need for different surgical procedure, need for repeat shockwave lithotripsy.   She would like to proceed with ureteroscopy.  Precautions discussed including fever, severe pain, inability to tolerate p.o.   Signed by Modena Slater, III, M.D. on 07/08/22 at 9:25 AM (EDT

## 2022-07-17 NOTE — Anesthesia Postprocedure Evaluation (Signed)
Anesthesia Post Note  Patient: DEMECIA NORTHWAY  Procedure(s) Performed: CYSTOSCOPY WITH LEFT URETEROSCOPY LASER LITHOTRIPSYAND STENT PLACEMENT (Left)     Patient location during evaluation: PACU Anesthesia Type: General Level of consciousness: sedated and patient cooperative Pain management: pain level controlled Vital Signs Assessment: post-procedure vital signs reviewed and stable Respiratory status: spontaneous breathing Cardiovascular status: stable Anesthetic complications: no   No notable events documented.  Last Vitals:  Vitals:   07/17/22 1345 07/17/22 1357  BP: 127/77 128/78  Pulse: 61 (!) 56  Resp: 16 14  Temp:    SpO2: 100% 100%    Last Pain:  Vitals:   07/17/22 1357  TempSrc:   PainSc: 0-No pain                 Lewie Loron

## 2022-07-18 ENCOUNTER — Encounter (HOSPITAL_COMMUNITY): Payer: Self-pay | Admitting: Urology

## 2023-03-04 ENCOUNTER — Emergency Department (HOSPITAL_BASED_OUTPATIENT_CLINIC_OR_DEPARTMENT_OTHER): Payer: Medicaid Other

## 2023-03-04 ENCOUNTER — Emergency Department (HOSPITAL_BASED_OUTPATIENT_CLINIC_OR_DEPARTMENT_OTHER)
Admission: EM | Admit: 2023-03-04 | Discharge: 2023-03-04 | Disposition: A | Discharge status: Home / Self Care | Payer: Medicaid Other | Attending: Emergency Medicine | Admitting: Emergency Medicine

## 2023-03-04 ENCOUNTER — Encounter (HOSPITAL_BASED_OUTPATIENT_CLINIC_OR_DEPARTMENT_OTHER): Payer: Self-pay

## 2023-03-04 DIAGNOSIS — R1012 Left upper quadrant pain: Secondary | ICD-10-CM | POA: Insufficient documentation

## 2023-03-04 DIAGNOSIS — R079 Chest pain, unspecified: Secondary | ICD-10-CM

## 2023-03-04 DIAGNOSIS — R600 Localized edema: Secondary | ICD-10-CM | POA: Diagnosis not present

## 2023-03-04 HISTORY — DX: Acute embolism and thrombosis of unspecified deep veins of unspecified lower extremity: I82.409

## 2023-03-04 LAB — LIPASE, BLOOD: Lipase: 39 U/L (ref 11–51)

## 2023-03-04 LAB — COMPREHENSIVE METABOLIC PANEL
ALT: 20 U/L (ref 0–44)
AST: 17 U/L (ref 15–41)
Albumin: 4.3 g/dL (ref 3.5–5.0)
Alkaline Phosphatase: 63 U/L (ref 38–126)
Anion gap: 8 (ref 5–15)
BUN: 11 mg/dL (ref 6–20)
CO2: 25 mmol/L (ref 22–32)
Calcium: 9.5 mg/dL (ref 8.9–10.3)
Chloride: 105 mmol/L (ref 98–111)
Creatinine, Ser: 0.92 mg/dL (ref 0.44–1.00)
GFR, Estimated: >60 mL/min (ref >60)
Glucose, Bld: 92 mg/dL (ref 70–99)
Potassium: 3.8 mmol/L (ref 3.5–5.1)
Sodium: 138 mmol/L (ref 135–145)
Total Bilirubin: 0.5 mg/dL (ref 0.3–1.2)
Total Protein: 7.4 g/dL (ref 6.5–8.1)

## 2023-03-04 LAB — PROTIME-INR
INR: 1 (ref 0.8–1.2)
Prothrombin Time: 12.7 seconds (ref 11.4–15.2)

## 2023-03-04 LAB — CBC WITH DIFFERENTIAL/PLATELET
Abs Immature Granulocytes: 0.01 10*3/uL (ref 0.00–0.07)
Basophils Absolute: 0 10*3/uL (ref 0.0–0.1)
Basophils Relative: 0 %
Eosinophils Absolute: 0.2 10*3/uL (ref 0.0–0.5)
Eosinophils Relative: 2 %
HCT: 40.6 % (ref 36.0–46.0)
Hemoglobin: 13.7 g/dL (ref 12.0–15.0)
Immature Granulocytes: 0 %
Lymphocytes Relative: 38 %
Lymphs Abs: 2.8 10*3/uL (ref 0.7–4.0)
MCH: 28.7 pg (ref 26.0–34.0)
MCHC: 33.7 g/dL (ref 30.0–36.0)
MCV: 85.1 fL (ref 80.0–100.0)
Monocytes Absolute: 0.5 10*3/uL (ref 0.1–1.0)
Monocytes Relative: 7 %
Neutro Abs: 3.8 10*3/uL (ref 1.7–7.7)
Neutrophils Relative %: 53 %
Platelets: 264 10*3/uL (ref 150–400)
RBC: 4.77 MIL/uL (ref 3.87–5.11)
RDW: 13.5 % (ref 11.5–15.5)
WBC: 7.4 10*3/uL (ref 4.0–10.5)
nRBC: 0 % (ref 0.0–0.2)

## 2023-03-04 LAB — TROPONIN I (HIGH SENSITIVITY)
Troponin I (High Sensitivity): <2 ng/L (ref <18)
Troponin I (High Sensitivity): <2 ng/L (ref <18)

## 2023-03-04 LAB — D-DIMER, QUANTITATIVE: D-Dimer, Quant: <0.27 ug/mL-FEU (ref 0.00–0.50)

## 2023-03-04 MED ORDER — OMEPRAZOLE 20 MG PO CPDR: 20.0000 mg | DELAYED_RELEASE_CAPSULE | Freq: Every day | ORAL | 0 refills | Status: AC

## 2023-03-04 MED ORDER — IOHEXOL 300 MG/ML  SOLN
100.0000 mL | Freq: Once | INTRAMUSCULAR | Status: AC | PRN
  Administered 2023-03-04: 100 mL via INTRAVENOUS

## 2023-03-04 NOTE — ED Notes (Addendum)
Patient transported to x-ray. ?

## 2023-03-04 NOTE — ED Notes (Signed)
ED Provider at bedside. 

## 2023-03-04 NOTE — ED Notes (Signed)
Attempted x 2 to obtain labs on pt. Pt is a difficult stick. Will have staff attempt an IV ultrasound

## 2023-03-04 NOTE — ED Triage Notes (Addendum)
Pt reports to the ED with complaints of weakness and pressure under her left breast. 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 Reports some shortness of breath.

## 2023-03-04 NOTE — ED Provider Notes (Addendum)
[place] [facility] AT [facility] Provider Note   CSN: [id number] Arrival date & time: 03/04/23  1049     History  Chief Complaint  Patient presents with   Shortness of Breath    Jasmine Christian is a 52 y.o. female.  Patient is a 52 year old female who presents with chest pain.  She complains of pain in her left breast area and feels like something is bulging out.  It is also in her upper abdomen.  She denies any shortness of breath.  No nausea or vomiting.  No urinary symptoms.  She said that she has had some "circulation problems" for a while.  She describes tingling in her hands and her feet for several months.  They seem to be getting more frequent.  No weakness noted.  She has had some intermittent sharp chest pains from time to time but today she had more intense pain.  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  She is not currently on anticoagulants.  She has had some leg swelling in her legs since about August.  She is also had some worsening crampy feeling in her calves.       Home Medications Prior to Admission medications   Medication Sig Start Date End Date Taking? Authorizing Provider  omeprazole (PRILOSEC) 20 MG capsule Take 1 capsule (20 mg total) by mouth daily. 03/04/23  Yes Rolan Bucco, MD  Cyanocobalamin (VITAMIN B-12 PO) Take 1 tablet by mouth daily.    [provider]  methocarbamol (ROBAXIN) 500 MG tablet Take 1 tablet (500 mg total) by mouth 2 (two) times daily. Patient not taking: Reported on 07/09/2022 01/24/22   Janell Quiet, PA-C  Multiple Vitamins-Minerals (ONE-A-DAY WOMENS PO) Take 1 tablet by mouth daily.    [provider]  naproxen (NAPROSYN) 500 MG tablet Take 1 tablet (500 mg total) by mouth 2 (two) times daily. 07/04/22   Cecil Cobbs, PA-C  oxyCODONE (OXY IR/ROXICODONE) 5 MG immediate release tablet Take 5 mg by mouth every 6 (six) hours as needed. 07/08/22   [provider]  oxyCODONE (OXY  IR/ROXICODONE) 5 MG immediate release tablet Take 1 tablet (5 mg total) by mouth every 6 (six) hours as needed for pain 07/17/22   Crista Elliot, MD      Allergies    Elemental sulfur, Sulfa antibiotics, and Aspartame and phenylalanine    Review of Systems   Review of Systems  Constitutional:  Negative for chills, diaphoresis, fatigue and fever.  HENT:  Negative for congestion, rhinorrhea and sneezing.   Eyes: Negative.   Respiratory:  Negative for cough, chest tightness and shortness of breath.   Cardiovascular:  Positive for chest pain and leg swelling.  Gastrointestinal:  Positive for abdominal pain. Negative for blood in stool, diarrhea, nausea and vomiting.  Genitourinary:  Negative for difficulty urinating, flank pain, frequency and hematuria.  Musculoskeletal:  Positive for myalgias. Negative for arthralgias and back pain.  Skin:  Negative for rash.  Neurological:  Positive for numbness. Negative for dizziness, speech difficulty, weakness and headaches.    Physical Exam Updated Vital Signs BP 121/82 (BP Location: Right Arm)   Pulse 67   Temp 98.1 F (36.7 C) (Oral)   Resp 13   Ht 5\' 6"  (1.676 m)   Wt 81.6 kg   LMP 06/28/2019   SpO2 98%   BMI 29.05 kg/m  Physical Exam Constitutional:      Appearance: She is well-developed.  HENT:  Head: Normocephalic and atraumatic.  Eyes:     Pupils: Pupils are equal, round, and reactive to light.  Cardiovascular:     Rate and Rhythm: Normal rate and regular rhythm.     Heart sounds: Normal heart sounds.  Pulmonary:     Effort: Pulmonary effort is normal. No respiratory distress.     Breath sounds: Normal breath sounds. No wheezing or rales.  Chest:     Chest wall: No tenderness.  Abdominal:     General: Bowel sounds are normal.     Palpations: Abdomen is soft.     Tenderness: There is abdominal tenderness (Tenderness to the left upper abdomen). There is no guarding or rebound.  Musculoskeletal:        General:  Normal range of motion.     Cervical back: Normal range of motion and neck supple.     Comments: 1+ edema to lower extremities bilaterally, no calf tenderness  Lymphadenopathy:     Cervical: No cervical adenopathy.  Skin:    General: Skin is warm and dry.     Findings: No rash.  Neurological:     Mental Status: She is alert and oriented to person, place, and time.     ED Results / Procedures / Treatments   Labs (all labs ordered are listed, but only abnormal results are displayed) Labs Reviewed  CBC WITH DIFFERENTIAL/PLATELET  COMPREHENSIVE METABOLIC PANEL  PROTIME-INR  D-DIMER, QUANTITATIVE  LIPASE, BLOOD  TROPONIN I (HIGH SENSITIVITY)  TROPONIN I (HIGH SENSITIVITY)    EKG EKG Interpretation  Date/Time:  Thursday Mar 04 2023 13:14:17 EDT Ventricular Rate:  60 PR Interval:  154 QRS Duration: 105 QT Interval:  434 QTC Calculation: 434 R Axis:   104 Text Interpretation: Sinus rhythm Atrial premature complex Low voltage, precordial leads Probable right ventricular hypertrophy Nonspecific T abnormalities, anterior leads since last tracing no significant change Confirmed by Rolan Bucco [phone]) on 03/04/2023 1:21:07 PM  Radiology CT ABDOMEN PELVIS W CONTRAST  Result Date: 03/04/2023 CLINICAL DATA:  Weakness and pressure in the left breast. Bilateral leg pain with history of DVT. EXAM: CT ABDOMEN AND PELVIS WITH CONTRAST TECHNIQUE: Multidetector CT imaging of the abdomen and pelvis was performed using the standard protocol following bolus administration of intravenous contrast. RADIATION DOSE REDUCTION: This exam was performed according to the departmental dose-optimization program which includes automated exposure control, adjustment of the mA and/or kV according to patient size and/or use of iterative reconstruction technique. CONTRAST:  OMNIPAQUE IOHEXOL 300 MG/ML  SOLN COMPARISON:  CT abdomen/pelvis 07/04/2022. FINDINGS: Lower chest: The lung bases are clear. The imaged  heart is unremarkable. Hepatobiliary: Two subcentimeter hypodense lesions in the liver are too small to characterize but are unchanged and likely reflect benign cysts requiring no specific imaging follow-up. There are no suspicious liver lesions. Pancreas: Unremarkable. Spleen: Unremarkable. Adrenals/Urinary Tract: The adrenals are unremarkable. There are single punctate bilateral nonobstructing renal stones there are no stones along the course of either ureter. There is no hydronephrosis or hydroureter. Subcentimeter hypodense lesions in the kidneys are too small to characterize but likely reflect benign cysts requiring no specific imaging follow-up. There are no suspicious parenchymal lesions. There is symmetric excretion of contrast into the collecting systems on the delayed images. The bladder is unremarkable. Stomach/Bowel: The stomach is unremarkable. There is no evidence of bowel obstruction. There is no abnormal bowel wall thickening or inflammatory change. There is colonic diverticulosis without evidence of acute diverticulitis. The appendix is normal. Vascular/Lymphatic: There is scattered  calcified plaque in the nonaneurysmal abdominal aorta. The major branch vessels are patent. The main portal and splenic veins are patent. The IVC and iliac vasculature are not well assessed due to bolus timing. There is no abdominal or pelvic lymphadenopathy. Reproductive: Uterus is surgically absent. There is no adnexal mass. Other: There is no ascites or free air. Musculoskeletal: There is no acute osseous abnormality or suspicious osseous lesion. IMPRESSION: 1. No acute findings in the abdomen or pelvis. 2. Punctate bilateral nonobstructing renal stones. 3. Diverticulosis without evidence of acute diverticulitis. Electronically Signed   By: Lesia Hausen M.D.   On: 03/04/2023 12:55   US Venous Img Lower Bilateral  Result Date: 03/04/2023 CLINICAL DATA:  BILATERAL leg pain, cramping, swelling EXAM: BILATERAL LOWER  EXTREMITY VENOUS DOPPLER ULTRASOUND TECHNIQUE: Gray-scale sonography with compression, as well as color and duplex ultrasound, were performed to evaluate the deep venous system(s) from the level of the common femoral vein through the popliteal and proximal calf veins. COMPARISON:  None Available. FINDINGS: VENOUS Normal compressibility of BILATERAL common femoral, superficial femoral, and popliteal veins, as well as the visualized calf veins. Visualized portions of BILATERAL profunda femoral vein and great saphenous vein unremarkable. No filling defects to suggest DVT on grayscale or color Doppler imaging. Doppler waveforms show normal direction of venous flow, normal respiratory plasticity and response to augmentation. OTHER Small Baker cyst RIGHT popliteal fossa 5.3 x 2.4 x 0.7 cm. Limitations: none IMPRESSION: No evidence of deep venous thrombosis in either lower extremity. Small Baker cyst RIGHT popliteal fossa. Electronically Signed   By: Ulyses Southward M.D.   On: 03/04/2023 12:22   DG Chest 2 View  Result Date: 03/04/2023 CLINICAL DATA:  Shortness of breath EXAM: CHEST - 2 VIEW COMPARISON:  CXR 09/09/16 FINDINGS: No pleural effusion. No pneumothorax. No focal airspace opacity. Normal cardiac and mediastinal contours. No radiographically apparent displaced rib fractures. Visualized upper abdomen is unremarkable. IMPRESSION: No focal airspace opacity. Electronically Signed   By: Lorenza Cambridge M.D.   On: 03/04/2023 11:47    Procedures Procedures    Medications Ordered in ED Medications  iohexol (OMNIPAQUE) 300 MG/ML solution 100 mL (100 mLs Intravenous Contrast Given 03/04/23 1235)    ED Course/ Medical Decision Making/ A&P                             Medical Decision Making Amount and/or Complexity of Data Reviewed Labs: ordered. Radiology: ordered.  Risk Prescription drug management.   Patient is a 52 year old female who presents with pain in her left chest and upper abdomen.  She is  concerned because she has previously had a PE.  However she does not feel short of breath like she did with her prior PE.  She does not have any exertional symptoms.  No other associated symptoms.  No symptoms that sound more concerning for aortic dissection.  Her EKG does not show any ischemic changes.  She had 2 negative troponins.  Chest x-ray two-view was interpreted by me and confirmed by the radiologist to show no evidence of pneumonia.  No pneumothorax.  No pulmonary edema.  No widened mediastinum.  She has some tenderness in her left upper abdomen which is the same pain that she is having in her chest.  There is no evidence of pancreatitis.  Her lipase is normal.  She does not have pain in her right upper quadrant that would be more concerning for gallbladder disease.  She  had a CT scan of her abdomen pelvis which showed no acute abnormality.  No evidence of obstruction or diverticulitis.  Her D-dimer is normal and she does not have other concerns for PE.  Given her leg cramping, she had ultrasounds of her lower extremities which were negative for DVT.  She has strong peripheral pulses.  Overall is well-appearing.  She was discharged home in good condition.  Will start her on some acid reducing medicine to see if this is potentially related to gastritis/reflux.  However I did advise her that we cannot do a complete cardiac evaluation here in the emergency department and she needs to follow-up with her primary care provider.  She states that she is currently working on getting a primary care provider.  Return precautions were given.  Final Clinical Impression(s) / ED Diagnoses Final diagnoses:  Chest pain, unspecified type  Left upper quadrant abdominal pain    Rx / DC Orders ED Discharge Orders          Ordered    omeprazole (PRILOSEC) 20 MG capsule  Daily        03/04/23 1400              Rolan Bucco, MD 03/04/23 1407    Rolan Bucco, MD 03/04/23 1408

## 2023-10-23 ENCOUNTER — Encounter (HOSPITAL_BASED_OUTPATIENT_CLINIC_OR_DEPARTMENT_OTHER): Payer: Self-pay | Admitting: Emergency Medicine

## 2023-10-23 ENCOUNTER — Emergency Department (HOSPITAL_BASED_OUTPATIENT_CLINIC_OR_DEPARTMENT_OTHER)
Admission: EM | Admit: 2023-10-23 | Discharge: 2023-10-23 | Disposition: A | Discharge status: Home / Self Care | Payer: Medicaid Other | Attending: Emergency Medicine | Admitting: Emergency Medicine

## 2023-10-23 ENCOUNTER — Other Ambulatory Visit: Payer: Self-pay

## 2023-10-23 DIAGNOSIS — K029 Dental caries, unspecified: Secondary | ICD-10-CM | POA: Diagnosis not present

## 2023-10-23 DIAGNOSIS — K0889 Other specified disorders of teeth and supporting structures: Secondary | ICD-10-CM | POA: Diagnosis present

## 2023-10-23 DIAGNOSIS — K047 Periapical abscess without sinus: Secondary | ICD-10-CM | POA: Diagnosis not present

## 2023-10-23 MED ORDER — OXYCODONE HCL 5 MG PO TABS: 5.0000 mg | ORAL_TABLET | Freq: Four times a day (QID) | ORAL | 0 refills | Status: AC | PRN

## 2023-10-23 MED ORDER — AMOXICILLIN-POT CLAVULANATE 875-125 MG PO TABS: 1.0000 | ORAL_TABLET | Freq: Two times a day (BID) | ORAL | 0 refills | Status: AC

## 2023-10-23 MED ORDER — OXYCODONE HCL 5 MG PO TABS
5.0000 mg | ORAL_TABLET | Freq: Once | ORAL | Status: AC
  Administered 2023-10-23: 5 mg via ORAL
  Filled 2023-10-23: qty 1

## 2023-10-23 MED ORDER — AMOXICILLIN-POT CLAVULANATE 875-125 MG PO TABS
1.0000 | ORAL_TABLET | Freq: Once | ORAL | Status: AC
  Administered 2023-10-23: 1 via ORAL
  Filled 2023-10-23: qty 1

## 2023-10-23 NOTE — ED Provider Notes (Signed)
[place] [facility] AT [facility] Provider Note   CSN: [id number] Arrival date & time: 10/23/23  2130     History  Chief Complaint  Patient presents with   Dental Injury    Jasmine Christian is a 52 y.o. female.  Patient presents to the emergency department today for evaluation of right lower molar pain.  Patient had a crown on the tooth in that area which has recently come completely off.  After that she had increasing pain and swelling related to the molar and face.  No fevers.  No difficulty breathing or swallowing.  Symptoms uncontrolled at home.       Home Medications Prior to Admission medications   Medication Sig Start Date End Date Taking? Authorizing Provider  amoxicillin-clavulanate (AUGMENTIN) 875-125 MG tablet Take 1 tablet by mouth every 12 (twelve) hours. 10/23/23  Yes Renne Crigler, PA-C  oxyCODONE (OXY IR/ROXICODONE) 5 MG immediate release tablet Take 1 tablet (5 mg total) by mouth every 6 (six) hours as needed for severe pain (pain score 7-10). 10/23/23  Yes Renne Crigler, PA-C  Cyanocobalamin (VITAMIN B-12 PO) Take 1 tablet by mouth daily.    [provider]  methocarbamol (ROBAXIN) 500 MG tablet Take 1 tablet (500 mg total) by mouth 2 (two) times daily. Patient not taking: Reported on 07/09/2022 01/24/22   Janell Quiet, PA-C  Multiple Vitamins-Minerals (ONE-A-DAY WOMENS PO) Take 1 tablet by mouth daily.    [provider]  naproxen (NAPROSYN) 500 MG tablet Take 1 tablet (500 mg total) by mouth 2 (two) times daily. 07/04/22   Cecil Cobbs, PA-C  omeprazole (PRILOSEC) 20 MG capsule Take 1 capsule (20 mg total) by mouth daily. 03/04/23   Rolan Bucco, MD      Allergies    Elemental sulfur, Sulfa antibiotics, and Aspartame and phenylalanine    Review of Systems   Review of Systems  Physical Exam Updated Vital Signs BP (!) 177/89 (BP Location: Right Arm)   Pulse 70   Temp 97.7 F (36.5 C) (Oral)   Resp  16   Ht 5\' 6"  (1.676 m)   Wt 81.6 kg   LMP 06/28/2019   SpO2 97%   BMI 29.05 kg/m  Physical Exam Vitals and nursing note reviewed.  Constitutional:      Appearance: She is well-developed.  HENT:     Head: Normocephalic and atraumatic.     Jaw: No trismus.     Right Ear: Tympanic membrane, ear canal and external ear normal.     Left Ear: Tympanic membrane, ear canal and external ear normal.     Nose: Nose normal.     Mouth/Throat:     Dentition: Abnormal dentition. Dental caries present. No dental abscesses.     Pharynx: Uvula midline. No uvula swelling.     Tonsils: No tonsillar abscesses.     Comments: Teeth in poor repair.  Patient with a large cavity in tooth #18 which has broken to the gumline. Eyes:     Conjunctiva/sclera: Conjunctivae normal.  Neck:     Comments: No neck swelling or Ludwig's angina Musculoskeletal:     Cervical back: Normal range of motion and neck supple.  Lymphadenopathy:     Cervical: No cervical adenopathy.  Skin:    General: Skin is warm and dry.  Neurological:     Mental Status: She is alert.     ED Results / Procedures / Treatments   Labs (all labs ordered are listed, but  only abnormal results are displayed) Labs Reviewed - No data to display  EKG None  Radiology No results found.  Procedures Procedures    Medications Ordered in ED Medications  oxyCODONE (Oxy IR/ROXICODONE) immediate release tablet 5 mg (has no administration in time range)  amoxicillin-clavulanate (AUGMENTIN) 875-125 MG per tablet 1 tablet (has no administration in time range)    ED Course/ Medical Decision Making/ A&P    Patient seen and examined. History obtained directly from patient.   Labs/EKG: None ordered.  Imaging: None ordered.  Medications/Fluids: Ordered: Augmentin, p.o. oxycodone 5 mg  Most recent vital signs reviewed and are as follows: BP (!) 177/89 (BP Location: Right Arm)   Pulse 70   Temp 97.7 F (36.5 C) (Oral)   Resp 16   Ht  5\' 6"  (1.676 m)   Wt 81.6 kg   LMP 06/28/2019   SpO2 97%   BMI 29.05 kg/m   Initial impression: dental pain/dental infection  Plan: Discharge to home.   Prescriptions written for: Augmentin, # 8 tablets oxycodone 5 mg  Other home care instructions discussed: Avoidance of chewing or other activities that makes the symptoms worse. Eat soft foods if needed and maintain good hydration.   ED return instructions discussed: Encouraged patient to return with worsening facial or neck swelling, difficulty breathing or swallowing, fever.   Follow-up instructions discussed: Patient encouraged to follow-up with provided dental referral, resources -- or their own dentist if able.                                 Medical Decision Making Risk Prescription drug management.   Patient presents for dental pain. They do not have a fever and do not appear septic. Exam unconcerning for Ludwig's angina or other deep tissue infection in neck and I do not feel that advanced imaging is indicated at this time. Low suspicion for PTA, RPA, epiglottis based on exam.   Patient will be treated for dental infection with antibiotic.  Due to complicated dental infection, she will be given a short course of pain medication.  Encouraged tylenol/NSAIDs as prescribed or as directed on the packaging for pain. Encouraged follow-up with a dentist for definitive and long-term management.          Final Clinical Impression(s) / ED Diagnoses Final diagnoses:  Dental infection    Rx / DC Orders ED Discharge Orders          Ordered    oxyCODONE (OXY IR/ROXICODONE) 5 MG immediate release tablet  Every 6 hours PRN        10/23/23 2303    amoxicillin-clavulanate (AUGMENTIN) 875-125 MG tablet  Every 12 hours        10/23/23 2303              Renne Crigler, PA-C 10/23/23 2306    Alvira Monday, MD 10/24/23 [phone]

## 2023-10-23 NOTE — Discharge Instructions (Addendum)
Please read and follow all provided instructions.  Your diagnoses today include:  1. Dental infection    The exam and treatment you received today has been provided on an emergency basis only. This is not a substitute for complete medical or dental care.  Tests performed today include: Vital signs. See below for your results today.   Medications prescribed:  Oxycodone - narcotic pain medication  DO NOT drive or perform any activities that require you to be awake and alert because this medicine can make you drowsy.   Augmentin - antibiotic  You have been prescribed an antibiotic medicine: take the entire course of medicine even if you are feeling better. Stopping early can cause the antibiotic not to work.  Take any prescribed medications only as directed.  Home care instructions:  Follow any educational materials contained in this packet.  Follow-up instructions: Please follow-up with your dentist for further evaluation of your symptoms.   Dental Assistance: See attached dental referral and/or resource guide.   Return instructions:  Please return to the Emergency Department if you experience worsening symptoms. Please return if you develop a fever, you develop more swelling in your face or neck, you have trouble breathing or swallowing food. Please return if you have any other emergent concerns.  Additional Information:  Your vital signs today were: BP (!) 177/89 (BP Location: Right Arm)   Pulse 70   Temp 97.7 F (36.5 C) (Oral)   Resp 16   Ht 5\' 6"  (1.676 m)   Wt 81.6 kg   LMP 06/28/2019   SpO2 97%   BMI 29.05 kg/m  If your blood pressure (BP) was elevated above 135/85 this visit, please have this repeated by your doctor within one month. --------------

## 2023-10-23 NOTE — ED Triage Notes (Signed)
Pt presents to the ED via POV with complaints of dental pain and a "broken tooth" to the L side of her mouth x 1 week. Pt states she didn't have any pain until yesterday when she was attempting to eat. Rates the pain 10/10 - she notes trying OTC pain meds without improvement. A&Ox4 at this time. Denies fevers, chills.
